# Patient Record
Sex: Female | Born: 1945 | Race: Black or African American | Hispanic: No | Marital: Married | Smoking: Former smoker
Health system: Southern US, Community
[De-identification: ages and names within clinical notes are randomized; demographics above are authoritative.]

## PROBLEM LIST (undated history)

## (undated) DIAGNOSIS — E8941 Symptomatic postprocedural ovarian failure: Secondary | ICD-10-CM

## (undated) DIAGNOSIS — K219 Gastro-esophageal reflux disease without esophagitis: Secondary | ICD-10-CM

## (undated) DIAGNOSIS — K589 Irritable bowel syndrome without diarrhea: Secondary | ICD-10-CM

## (undated) DIAGNOSIS — D126 Benign neoplasm of colon, unspecified: Secondary | ICD-10-CM

## (undated) DIAGNOSIS — M1990 Unspecified osteoarthritis, unspecified site: Secondary | ICD-10-CM

## (undated) DIAGNOSIS — M5430 Sciatica, unspecified side: Secondary | ICD-10-CM

## (undated) DIAGNOSIS — N318 Other neuromuscular dysfunction of bladder: Secondary | ICD-10-CM

## (undated) DIAGNOSIS — E785 Hyperlipidemia, unspecified: Secondary | ICD-10-CM

## (undated) DIAGNOSIS — M545 Low back pain: Secondary | ICD-10-CM

## (undated) DIAGNOSIS — M5126 Other intervertebral disc displacement, lumbar region: Secondary | ICD-10-CM

## (undated) HISTORY — DX: Sciatica, unspecified side: M54.30

## (undated) HISTORY — DX: Gastro-esophageal reflux disease without esophagitis: K21.9

## (undated) HISTORY — DX: Irritable bowel syndrome, unspecified: K58.9

## (undated) HISTORY — DX: Other intervertebral disc displacement, lumbar region: M51.26

## (undated) HISTORY — DX: Low back pain: M54.5

## (undated) HISTORY — DX: Hyperlipidemia, unspecified: E78.5

## (undated) HISTORY — DX: Other neuromuscular dysfunction of bladder: N31.8

## (undated) HISTORY — DX: Symptomatic postprocedural ovarian failure: E89.41

## (undated) HISTORY — DX: Benign neoplasm of colon, unspecified: D12.6

## (undated) HISTORY — DX: Unspecified osteoarthritis, unspecified site: M19.90

---

## 1971-03-05 HISTORY — PX: APPENDECTOMY: SHX54

## 1974-03-04 HISTORY — PX: ABDOMINAL HYSTERECTOMY: SHX81

## 1978-03-04 HISTORY — PX: KIDNEY SURGERY: SHX687

## 1982-03-04 HISTORY — PX: CHOLECYSTECTOMY: SHX55

## 1999-07-25 ENCOUNTER — Encounter: Payer: Self-pay | Admitting: Family Medicine

## 1999-07-25 ENCOUNTER — Encounter: Admission: RE | Admit: 1999-07-25 | Discharge: 1999-07-25 | Payer: Self-pay | Admitting: Family Medicine

## 1999-08-14 ENCOUNTER — Encounter: Payer: Self-pay | Admitting: Family Medicine

## 1999-08-14 ENCOUNTER — Encounter: Admission: RE | Admit: 1999-08-14 | Discharge: 1999-08-14 | Payer: Self-pay | Admitting: Family Medicine

## 1999-11-02 ENCOUNTER — Encounter: Admission: RE | Admit: 1999-11-02 | Discharge: 1999-11-02 | Payer: Self-pay | Admitting: Family Medicine

## 1999-11-02 ENCOUNTER — Encounter: Payer: Self-pay | Admitting: Family Medicine

## 1999-11-07 ENCOUNTER — Encounter: Admission: RE | Admit: 1999-11-07 | Discharge: 1999-11-07 | Payer: Self-pay | Admitting: Family Medicine

## 1999-11-07 ENCOUNTER — Encounter: Payer: Self-pay | Admitting: Family Medicine

## 1999-11-14 ENCOUNTER — Encounter: Payer: Self-pay | Admitting: Family Medicine

## 1999-11-14 ENCOUNTER — Encounter: Admission: RE | Admit: 1999-11-14 | Discharge: 1999-11-14 | Payer: Self-pay | Admitting: Family Medicine

## 1999-11-15 ENCOUNTER — Other Ambulatory Visit: Admission: RE | Admit: 1999-11-15 | Discharge: 1999-11-15 | Payer: Self-pay | Admitting: Family Medicine

## 1999-12-13 ENCOUNTER — Ambulatory Visit (HOSPITAL_COMMUNITY): Admission: RE | Admit: 1999-12-13 | Discharge: 1999-12-13 | Payer: Self-pay | Admitting: Gastroenterology

## 2000-01-13 ENCOUNTER — Emergency Department (HOSPITAL_COMMUNITY): Admission: EM | Admit: 2000-01-13 | Discharge: 2000-01-13 | Payer: Self-pay | Admitting: Emergency Medicine

## 2000-08-29 ENCOUNTER — Encounter: Admission: RE | Admit: 2000-08-29 | Discharge: 2000-08-29 | Payer: Self-pay | Admitting: Occupational Medicine

## 2000-08-29 ENCOUNTER — Encounter: Payer: Self-pay | Admitting: Occupational Medicine

## 2000-09-18 ENCOUNTER — Encounter: Payer: Self-pay | Admitting: Specialist

## 2000-09-19 ENCOUNTER — Ambulatory Visit (HOSPITAL_COMMUNITY): Admission: RE | Admit: 2000-09-19 | Discharge: 2000-09-19 | Payer: Self-pay | Admitting: Specialist

## 2000-09-19 ENCOUNTER — Encounter: Payer: Self-pay | Admitting: Specialist

## 2001-02-17 ENCOUNTER — Encounter: Admission: RE | Admit: 2001-02-17 | Discharge: 2001-03-12 | Payer: Self-pay | Admitting: Orthopedic Surgery

## 2001-06-02 ENCOUNTER — Encounter: Payer: Self-pay | Admitting: Family Medicine

## 2001-06-02 ENCOUNTER — Encounter: Admission: RE | Admit: 2001-06-02 | Discharge: 2001-06-02 | Payer: Self-pay | Admitting: Family Medicine

## 2001-09-29 ENCOUNTER — Encounter: Payer: Self-pay | Admitting: Family Medicine

## 2001-09-29 ENCOUNTER — Encounter: Admission: RE | Admit: 2001-09-29 | Discharge: 2001-09-29 | Payer: Self-pay | Admitting: Family Medicine

## 2001-11-27 ENCOUNTER — Encounter: Payer: Self-pay | Admitting: Urology

## 2001-11-27 ENCOUNTER — Ambulatory Visit (HOSPITAL_COMMUNITY): Admission: RE | Admit: 2001-11-27 | Discharge: 2001-11-27 | Payer: Self-pay | Admitting: Urology

## 2001-11-30 ENCOUNTER — Encounter: Payer: Self-pay | Admitting: Family Medicine

## 2001-11-30 ENCOUNTER — Encounter: Admission: RE | Admit: 2001-11-30 | Discharge: 2001-11-30 | Payer: Self-pay | Admitting: Family Medicine

## 2001-12-01 ENCOUNTER — Encounter: Payer: Self-pay | Admitting: Family Medicine

## 2001-12-01 ENCOUNTER — Encounter: Admission: RE | Admit: 2001-12-01 | Discharge: 2001-12-01 | Payer: Self-pay | Admitting: Family Medicine

## 2002-04-02 ENCOUNTER — Encounter: Payer: Self-pay | Admitting: Family Medicine

## 2002-04-02 ENCOUNTER — Encounter: Admission: RE | Admit: 2002-04-02 | Discharge: 2002-04-02 | Payer: Self-pay | Admitting: Family Medicine

## 2002-10-01 ENCOUNTER — Encounter: Payer: Self-pay | Admitting: Family Medicine

## 2002-10-01 ENCOUNTER — Encounter: Admission: RE | Admit: 2002-10-01 | Discharge: 2002-10-01 | Payer: Self-pay | Admitting: Family Medicine

## 2003-08-29 ENCOUNTER — Emergency Department (HOSPITAL_COMMUNITY): Admission: EM | Admit: 2003-08-29 | Discharge: 2003-08-29 | Payer: Self-pay | Admitting: Emergency Medicine

## 2003-10-04 ENCOUNTER — Encounter: Admission: RE | Admit: 2003-10-04 | Discharge: 2003-10-04 | Payer: Self-pay | Admitting: Family Medicine

## 2004-03-04 HISTORY — PX: COLON SURGERY: SHX602

## 2004-04-16 ENCOUNTER — Encounter: Admission: RE | Admit: 2004-04-16 | Discharge: 2004-04-16 | Payer: Self-pay | Admitting: Family Medicine

## 2004-04-19 ENCOUNTER — Encounter: Payer: Self-pay | Admitting: Family Medicine

## 2004-04-19 ENCOUNTER — Encounter: Admission: RE | Admit: 2004-04-19 | Discharge: 2004-04-19 | Payer: Self-pay | Admitting: Family Medicine

## 2004-04-27 ENCOUNTER — Ambulatory Visit (HOSPITAL_COMMUNITY): Admission: RE | Admit: 2004-04-27 | Discharge: 2004-04-27 | Payer: Self-pay | Admitting: Gastroenterology

## 2004-10-18 ENCOUNTER — Encounter: Admission: RE | Admit: 2004-10-18 | Discharge: 2004-10-18 | Payer: Self-pay | Admitting: Family Medicine

## 2005-04-30 ENCOUNTER — Encounter: Payer: Self-pay | Admitting: Family Medicine

## 2005-04-30 ENCOUNTER — Encounter: Admission: RE | Admit: 2005-04-30 | Discharge: 2005-04-30 | Payer: Self-pay | Admitting: Family Medicine

## 2005-07-18 ENCOUNTER — Other Ambulatory Visit: Admission: RE | Admit: 2005-07-18 | Discharge: 2005-07-18 | Payer: Self-pay | Admitting: Family Medicine

## 2005-07-31 ENCOUNTER — Ambulatory Visit (HOSPITAL_BASED_OUTPATIENT_CLINIC_OR_DEPARTMENT_OTHER): Admission: RE | Admit: 2005-07-31 | Discharge: 2005-07-31 | Payer: Self-pay | Admitting: Orthopedic Surgery

## 2005-08-08 ENCOUNTER — Encounter: Admission: RE | Admit: 2005-08-08 | Discharge: 2005-09-19 | Payer: Self-pay | Admitting: Orthopedic Surgery

## 2005-09-11 ENCOUNTER — Encounter: Admission: RE | Admit: 2005-09-11 | Discharge: 2005-09-11 | Payer: Self-pay | Admitting: Orthopedic Surgery

## 2005-10-21 ENCOUNTER — Encounter: Admission: RE | Admit: 2005-10-21 | Discharge: 2005-10-21 | Payer: Self-pay | Admitting: Family Medicine

## 2006-02-14 ENCOUNTER — Ambulatory Visit: Payer: Self-pay | Admitting: Internal Medicine

## 2006-02-19 ENCOUNTER — Ambulatory Visit: Payer: Self-pay | Admitting: Gastroenterology

## 2006-02-19 ENCOUNTER — Ambulatory Visit: Payer: Self-pay | Admitting: Internal Medicine

## 2006-02-19 ENCOUNTER — Encounter: Payer: Self-pay | Admitting: Family Medicine

## 2006-03-11 ENCOUNTER — Encounter: Payer: Self-pay | Admitting: Family Medicine

## 2006-03-24 ENCOUNTER — Ambulatory Visit: Payer: Self-pay | Admitting: Internal Medicine

## 2006-08-14 ENCOUNTER — Encounter: Payer: Self-pay | Admitting: Family Medicine

## 2006-10-23 ENCOUNTER — Encounter: Admission: RE | Admit: 2006-10-23 | Discharge: 2006-10-23 | Payer: Self-pay | Admitting: Family Medicine

## 2007-01-16 ENCOUNTER — Encounter: Admission: RE | Admit: 2007-01-16 | Discharge: 2007-01-16 | Payer: Self-pay | Admitting: Family Medicine

## 2007-01-16 ENCOUNTER — Encounter: Payer: Self-pay | Admitting: Family Medicine

## 2007-02-17 ENCOUNTER — Ambulatory Visit: Payer: Self-pay | Admitting: Internal Medicine

## 2007-02-25 ENCOUNTER — Encounter: Payer: Self-pay | Admitting: Family Medicine

## 2007-02-25 ENCOUNTER — Encounter: Payer: Self-pay | Admitting: Internal Medicine

## 2007-02-25 ENCOUNTER — Ambulatory Visit: Payer: Self-pay | Admitting: Internal Medicine

## 2007-02-25 LAB — HM COLONOSCOPY: HM Colonoscopy: ABNORMAL

## 2007-03-05 HISTORY — PX: OVARIAN CYST REMOVAL: SHX89

## 2007-03-24 ENCOUNTER — Encounter: Payer: Self-pay | Admitting: Family Medicine

## 2007-07-16 ENCOUNTER — Inpatient Hospital Stay (HOSPITAL_COMMUNITY): Admission: RE | Admit: 2007-07-16 | Discharge: 2007-07-19 | Payer: Self-pay | Admitting: Obstetrics and Gynecology

## 2007-07-16 ENCOUNTER — Encounter: Payer: Self-pay | Admitting: Family Medicine

## 2007-07-16 ENCOUNTER — Encounter (INDEPENDENT_AMBULATORY_CARE_PROVIDER_SITE_OTHER): Payer: Self-pay | Admitting: Obstetrics and Gynecology

## 2007-10-26 ENCOUNTER — Encounter: Admission: RE | Admit: 2007-10-26 | Discharge: 2007-10-26 | Payer: Self-pay | Admitting: Family Medicine

## 2008-01-07 ENCOUNTER — Encounter: Payer: Self-pay | Admitting: Family Medicine

## 2008-02-29 ENCOUNTER — Ambulatory Visit: Payer: Self-pay | Admitting: Family Medicine

## 2008-02-29 DIAGNOSIS — E785 Hyperlipidemia, unspecified: Secondary | ICD-10-CM

## 2008-02-29 DIAGNOSIS — E8941 Symptomatic postprocedural ovarian failure: Secondary | ICD-10-CM

## 2008-02-29 DIAGNOSIS — R635 Abnormal weight gain: Secondary | ICD-10-CM | POA: Insufficient documentation

## 2008-02-29 DIAGNOSIS — N318 Other neuromuscular dysfunction of bladder: Secondary | ICD-10-CM

## 2008-02-29 HISTORY — DX: Hyperlipidemia, unspecified: E78.5

## 2008-02-29 HISTORY — DX: Symptomatic postprocedural ovarian failure: E89.41

## 2008-02-29 HISTORY — DX: Other neuromuscular dysfunction of bladder: N31.8

## 2008-03-01 LAB — CONVERTED CEMR LAB
ALT: 17 units/L (ref 0–35)
Calcium: 8.4 mg/dL (ref 8.4–10.5)
Chloride: 106 meq/L (ref 96–112)
Cholesterol: 152 mg/dL (ref 0–200)
Potassium: 4.1 meq/L (ref 3.5–5.3)
Sodium: 140 meq/L (ref 135–145)
Total Bilirubin: 0.7 mg/dL (ref 0.3–1.2)
Total Protein: 6.3 g/dL (ref 6.0–8.3)
VLDL: 9 mg/dL (ref 0–40)

## 2008-03-15 ENCOUNTER — Encounter: Payer: Self-pay | Admitting: Family Medicine

## 2008-04-14 ENCOUNTER — Telehealth: Payer: Self-pay | Admitting: Family Medicine

## 2008-04-20 ENCOUNTER — Encounter: Payer: Self-pay | Admitting: Internal Medicine

## 2008-10-03 ENCOUNTER — Encounter: Payer: Self-pay | Admitting: Family Medicine

## 2008-10-26 ENCOUNTER — Encounter: Admission: RE | Admit: 2008-10-26 | Discharge: 2008-10-26 | Payer: Self-pay | Admitting: Internal Medicine

## 2009-05-25 ENCOUNTER — Telehealth: Payer: Self-pay | Admitting: Internal Medicine

## 2009-05-26 ENCOUNTER — Telehealth: Payer: Self-pay | Admitting: Internal Medicine

## 2009-05-29 ENCOUNTER — Ambulatory Visit: Payer: Self-pay | Admitting: Family Medicine

## 2009-05-29 DIAGNOSIS — D126 Benign neoplasm of colon, unspecified: Secondary | ICD-10-CM

## 2009-05-29 DIAGNOSIS — K219 Gastro-esophageal reflux disease without esophagitis: Secondary | ICD-10-CM

## 2009-05-29 HISTORY — DX: Gastro-esophageal reflux disease without esophagitis: K21.9

## 2009-05-29 HISTORY — DX: Benign neoplasm of colon, unspecified: D12.6

## 2009-05-29 LAB — CONVERTED CEMR LAB
Cholesterol, target level: 200 mg/dL
LDL Goal: 160 mg/dL

## 2009-05-31 LAB — CONVERTED CEMR LAB
Bilirubin, Direct: 0 mg/dL (ref 0.0–0.3)
HDL: 59.9 mg/dL (ref >39.00)
LDL Cholesterol: 93 mg/dL (ref 0–99)
Total CHOL/HDL Ratio: 3

## 2009-10-27 ENCOUNTER — Encounter: Admission: RE | Admit: 2009-10-27 | Discharge: 2009-10-27 | Payer: Self-pay | Admitting: Family Medicine

## 2009-11-08 ENCOUNTER — Ambulatory Visit: Payer: Self-pay | Admitting: Family Medicine

## 2009-11-08 LAB — CONVERTED CEMR LAB
Alkaline Phosphatase: 39 units/L (ref 39–117)
Basophils Absolute: 0.1 10*3/uL (ref 0.0–0.1)
Bilirubin, Direct: 0.2 mg/dL (ref 0.0–0.3)
Calcium: 8.6 mg/dL (ref 8.4–10.5)
Cholesterol: 165 mg/dL (ref 0–200)
Eosinophils Absolute: 0.2 10*3/uL (ref 0.0–0.7)
GFR calc non Af Amer: 122.24 mL/min (ref >60)
Glucose, Bld: 71 mg/dL (ref 70–99)
Glucose, Urine, Semiquant: NEGATIVE
HDL: 44.5 mg/dL (ref >39.00)
Hemoglobin: 14 g/dL (ref 12.0–15.0)
Lymphs Abs: 3.5 10*3/uL (ref 0.7–4.0)
Monocytes Absolute: 0.6 10*3/uL (ref 0.1–1.0)
Neutro Abs: 4.4 10*3/uL (ref 1.4–7.7)
Potassium: 4.1 meq/L (ref 3.5–5.1)
Protein, U semiquant: NEGATIVE
RBC: 4.85 M/uL (ref 3.87–5.11)
Sodium: 141 meq/L (ref 135–145)
Specific Gravity, Urine: 1.02
Total Bilirubin: 0.9 mg/dL (ref 0.3–1.2)
Total CHOL/HDL Ratio: 4
VLDL: 7 mg/dL (ref 0.0–40.0)
WBC: 8.8 10*3/uL (ref 4.5–10.5)
pH: 7.5

## 2009-11-16 ENCOUNTER — Ambulatory Visit: Payer: Self-pay | Admitting: Family Medicine

## 2009-11-28 ENCOUNTER — Telehealth: Payer: Self-pay | Admitting: Family Medicine

## 2009-12-01 ENCOUNTER — Ambulatory Visit: Payer: Self-pay | Admitting: Family Medicine

## 2009-12-01 LAB — CONVERTED CEMR LAB
OCCULT 2: NEGATIVE
OCCULT 3: NEGATIVE

## 2010-01-02 ENCOUNTER — Telehealth: Payer: Self-pay | Admitting: Family Medicine

## 2010-01-03 ENCOUNTER — Telehealth: Payer: Self-pay | Admitting: Family Medicine

## 2010-01-10 ENCOUNTER — Telehealth: Payer: Self-pay | Admitting: Family Medicine

## 2010-01-11 ENCOUNTER — Encounter: Payer: Self-pay | Admitting: Family Medicine

## 2010-02-12 ENCOUNTER — Encounter: Payer: Self-pay | Admitting: Family Medicine

## 2010-02-27 ENCOUNTER — Ambulatory Visit
Admission: RE | Admit: 2010-02-27 | Discharge: 2010-02-27 | Payer: Self-pay | Source: Home / Self Care | Attending: Internal Medicine | Admitting: Internal Medicine

## 2010-02-28 ENCOUNTER — Telehealth: Payer: Self-pay | Admitting: Internal Medicine

## 2010-03-01 ENCOUNTER — Ambulatory Visit
Admission: RE | Admit: 2010-03-01 | Discharge: 2010-03-01 | Payer: Self-pay | Source: Home / Self Care | Attending: Family Medicine | Admitting: Family Medicine

## 2010-03-12 ENCOUNTER — Telehealth: Payer: Self-pay | Admitting: Family Medicine

## 2010-03-12 DIAGNOSIS — M545 Low back pain, unspecified: Secondary | ICD-10-CM

## 2010-03-12 HISTORY — DX: Low back pain, unspecified: M54.50

## 2010-03-13 ENCOUNTER — Telehealth: Payer: Self-pay | Admitting: Internal Medicine

## 2010-03-14 ENCOUNTER — Encounter
Admission: RE | Admit: 2010-03-14 | Discharge: 2010-03-14 | Payer: Self-pay | Source: Home / Self Care | Attending: Family Medicine | Admitting: Family Medicine

## 2010-03-15 ENCOUNTER — Ambulatory Visit
Admission: RE | Admit: 2010-03-15 | Discharge: 2010-03-15 | Payer: Self-pay | Source: Home / Self Care | Attending: Family Medicine | Admitting: Family Medicine

## 2010-03-16 ENCOUNTER — Encounter: Payer: Self-pay | Admitting: Family Medicine

## 2010-04-03 NOTE — Progress Notes (Signed)
Summary: Pt called re: Aciphex and re: getting colonoscopy sch  Phone Note Call from Patient Call back at [phone] cell   Caller: Patient Summary of Call: Pt called and said that the Aciphex is doing well, but pts stomach is still sore. Pt said that she needs to get colonoscopy sch. Pls call.  Initial call taken by: Lucy Antigua,  November 28, 2009 2:23 PM  Follow-up for Phone Call        pt had colonoscopy in 2008 with recommended f/u in 5 years.  My recommendation for seeing GI at this time would be if she is having ongoing abd pain on the Aciphex.  If pain is improving on Aciphex, she may wish to  give this  full 4- 6 weeks. Follow-up by: Evelena Peat MD,  November 28, 2009 2:49 PM  Additional Follow-up for Phone Call Additional follow up Details #1::        Pt informed and she voiced his understanding Additional Follow-up by: Sid Falcon LPN,  November 28, 2009 5:32 PM

## 2010-04-03 NOTE — Progress Notes (Signed)
Summary: pt wants to cancel pa for Aciphex  Phone Note Call from Patient Call back at Work Phone [phone]   Caller: Patient-live call Summary of Call: cancel the prior auth for Aciphex. She wants to switch to prevacid. Call [facility] in [phone] Initial call taken by: Warnell Forester,  January 03, 2010 4:08 PM  Follow-up for Phone Call        [facility] to call in Prevacid 30 mg daily. Follow-up by: Evelena Peat MD,  January 03, 2010 5:37 PM    New/Updated Medications: PREVACID 30 MG CPDR (LANSOPRAZOLE) once daily Prescriptions: PREVACID 30 MG CPDR (LANSOPRAZOLE) once daily  #30 x 11   Entered by:   Sid Falcon LPN   Authorized by:   Evelena Peat MD   Signed by:   Sid Falcon LPN on 78/46/9629   Method used:   Electronically to        [facility] [phone]* (retail)       [address] [place], [place]  [address]       Ph: [phone] or [phone]       Fax: [phone]   RxID:   [id number]

## 2010-04-03 NOTE — Progress Notes (Signed)
Summary: REQUEST FOR WRITTEN RX (Prevacid) - SEE NOTATION  Phone Note Refill Request Message from:  Patient on January 10, 2010 9:54 AM  Refills Requested: Medication #1:  PREVACID 30 MG CPDR once daily   Notes: Pt would like to have written script that she will take to [facility] herself and have it filled - pt wants to pay out of pocket for med because she is tired of dealing with insurance.  Pt would like to have written script that she will take to [facility] herself and have it filled - pt wants to pay out of pocket for med because she is tired of dealing with insurance.   Initial call taken by: Debbra Riding,  January 10, 2010 9:56 AM  Follow-up for Phone Call        written Follow-up by: Evelena Peat MD,  January 10, 2010 1:04 PM  Additional Follow-up for Phone Call Additional follow up Details #1::        Pt informed ready for pick-up Additional Follow-up by: Sid Falcon LPN,  January 10, 2010 1:12 PM    Prescriptions: PREVACID 30 MG CPDR (LANSOPRAZOLE) once daily  #30 x 11   Entered by:   Sid Falcon LPN   Authorized by:   Evelena Peat MD   Signed by:   Sid Falcon LPN on 24/40/1027   Method used:   Print then Give to Patient   RxID:   [id number]

## 2010-04-03 NOTE — Progress Notes (Signed)
Summary: triage / constipation, vomit, wants colonoscopy  Phone Note Call from Patient Call back at [facility] Phone [phone] Call back at [phone]  (cell)   Caller: Patient Call For: Dr. Marina Goodell Reason for Call: Talk to Nurse Summary of Call: pt says she "spit up black and red blood this morning"...  Initial call taken by: Vallarie Mare,  May 25, 2009 12:24 PM  Follow-up for Phone Call         Pt. took a Dulcolax tab. last pm but denies constipation.Says she just wanted to clean her system out because she felt bloated.This am had loose dk. brown  stool and at the same time she vomited some water that was mixed with some streaks of BRB followed by green liquid x1..Wants a colonoscopy because last one was 4 years ago. Follow-up by: Teryl Lucy RN,  May 25, 2009 1:40 PM  Additional Follow-up for Phone Call Additional follow up Details #1::        She is not due for colon. last colonoscopy 02-2007. Has not been seen since. She can schedule a routine visit with me or see an extender sooner for office eval Additional Follow-up by: Hilarie Fredrickson MD,  May 25, 2009 1:44 PM    Additional Follow-up for Phone Call Additional follow up Details #2::    pt called back to add that she is "very nautious" Follow-up by: Vallarie Mare,  May 25, 2009 1:59 PM  Additional Follow-up for Phone Call Additional follow up Details #3:: Details for Additional Follow-up Action Taken: Given appt. with the NP on Tuesday she is requesting eval. of symptoms.No further vomiting has occurred.Knows to go to er if lg.amt of bleeding occurs. Additional Follow-up by: Teryl Lucy RN,  May 25, 2009 2:13 PM

## 2010-04-03 NOTE — Assessment & Plan Note (Signed)
Summary: NEW TO EST/CCM   Vital Signs:  Patient profile:   65 year old female Menstrual status:  hysterectomy Height:      58 inches Weight:      182 pounds BMI:     38.18 Temp:     98.7 degrees F oral Pulse rate:   80 / minute Pulse rhythm:   regular Resp:     12 per minute BP sitting:   130 / 84  (left arm) Cuff size:   large  Vitals Entered By: Sid Falcon LPN (May 29, 2009 10:59 AM)  Nutrition Counseling: Patient's BMI is greater than 25 and therefore counseled on weight management options. CC: New to establish, Lipid Management, Abdominal Pain     Menstrual Status hysterectomy Last PAP Result Unknown   History of Present Illness: New pt to establish care.    Hx GERD controlled with Nexium.  Recurrent sxs not controlled with Prilosec.  Hx colon polyps and reportedly due for repeat colonoscopy 2013.  Hyperlipidemia treated with Simvastatin.  No side effects.  No hx CAD or PVD.  Dyspepsia History:      She has no alarm features of dyspepsia including no history of melena, hematochezia, dysphagia, persistent vomiting, or involuntary weight loss > 5%.  There is a prior history of GERD.  The patient does not have a prior history of documented ulcer disease.  The dominant symptom is heartburn or acid reflux.  An H-2 blocker medication is not currently being taken.  She has no history of a positive H. Pylori serology.  A prior EGD has been done which showed no evidence for moderate or severe esophagitis or Barrett's esophagus.    Lipid Management History:      Positive NCEP/ATP III risk factors include female age 39 years old or older.  Negative NCEP/ATP III risk factors include non-diabetic, no family history for ischemic heart disease, non-tobacco-user status, non-hypertensive, no ASHD (atherosclerotic heart disease), no prior stroke/TIA, no peripheral vascular disease, and no history of aortic aneurysm.      Allergies: 1)  ! Pcn 2)  ! Codeine 3)  ! Sulfa  Past  History:  Past Surgical History: Last updated: 02/29/2008 Cyst removed from the urinary tract by Dr. Donovan Kail.  Complete Hysterectomy 1976 Cholecystectomy 1982 Left Kidney surgery to help rotate kidney 1980 Colon polyp removed 2006   Family History: Last updated: 05/29/2009 Mother Pancreatic cancer Brother 75 cerebral aneurysm Sister hyperlipidemia.  Social History: Last updated: 05/29/2009 REtired.  Married to [employer] with 2 daughters.   Former Smoker Quit 70s Alcohol use-no Drug use-no Regular exercise-yes  Risk Factors: Caffeine Use: 0 (02/29/2008) Exercise: yes (02/29/2008)  Risk Factors: Smoking Status: quit (02/29/2008)  Past Medical History: GERD Hyperlipidemia Colon polyps  Family History: Mother Pancreatic cancer Brother 14 cerebral aneurysm Sister hyperlipidemia.  Social History: REtired.  Married to [employer] with 2 daughters.   Former Smoker Quit 70s Alcohol use-no Drug use-no Regular exercise-yes  Review of Systems  The patient denies anorexia, fever, weight loss, weight gain, chest pain, syncope, dyspnea on exertion, peripheral edema, prolonged cough, headaches, hemoptysis, abdominal pain, melena, hematochezia, severe indigestion/heartburn, hematuria, incontinence, and muscle weakness.    Physical Exam  General:  Well-developed,well-nourished,in no acute distress; alert,appropriate and cooperative throughout examination Head:  Normocephalic and atraumatic without obvious abnormalities. No apparent alopecia or balding. Ears:  External ear exam shows no significant lesions or deformities.  Otoscopic examination reveals clear canals, tympanic membranes are intact bilaterally without bulging, retraction, inflammation  or discharge. Hearing is grossly normal bilaterally. Mouth:  Oral mucosa and oropharynx without lesions or exudates.  Teeth in good repair. Neck:  No deformities, masses, or tenderness noted. Lungs:  Normal respiratory effort,  chest expands symmetrically. Lungs are clear to auscultation, no crackles or wheezes. Heart:  normal rate and regular rhythm.   Extremities:  no edema.   Impression & Recommendations:  Problem # 1:  HYPERLIPIDEMIA (ICD-272.4) reassess labs Her updated medication list for this problem includes:    Simvastatin 20 Mg Tabs (Simvastatin) .Marland Kitchen... Take 1 tablet by mouth once a day at bedtime  Orders: TLB-Hepatic/Liver Function Pnl (80076-HEPATIC) TLB-Lipid Panel (80061-LIPID)  Problem # 2:  GERD (ICD-530.81) Assessment: Deteriorated Change to Nexium and pt instructed to give feedback if no improvement in 1 month. Her updated medication list for this problem includes:    Nexium 40 Mg Cpdr (Esomeprazole magnesium) .Marland Kitchen... Take 1 tablet by mouth once a day  Problem # 3:  COLONIC POLYPS (ICD-211.3) Assessment: Unchanged  Complete Medication List: 1)  Nexium 40 Mg Cpdr (Esomeprazole magnesium) .... Take 1 tablet by mouth once a day 2)  Est Estrogens-methyltest 1.25-2.5 Mg Tabs (Est estrogens-methyltest) .... Once daily 3)  Simvastatin 20 Mg Tabs (Simvastatin) .... Take 1 tablet by mouth once a day at bedtime  Dyspepsia Assessment/Plan:  Step Therapy: GERD Treatment Protocols:    Step-1: failed  Lipid Assessment/Plan:      Based on NCEP/ATP III, the patient's risk factor category is "0-1 risk factors".  The patient's lipid goals are as follows: Total cholesterol goal is 200; LDL cholesterol goal is 160; HDL cholesterol goal is 40; Triglyceride goal is 150.    Patient Instructions: 1)  Please schedule a follow-up appointment in 6 months .  Prescriptions: SIMVASTATIN 20 MG TABS (SIMVASTATIN) Take 1 tablet by mouth once a day at bedtime  #90 x 3   Entered and Authorized by:   Evelena Peat MD   Signed by:   Evelena Peat MD on 05/29/2009   Method used:   Electronically to        [facility] [phone]* (retail)       [address] [place], [place]  [address]        Ph: [phone] or [phone]       Fax: [phone]   RxID:   [id number] NEXIUM 40 MG CPDR (ESOMEPRAZOLE MAGNESIUM) Take 1 tablet by mouth once a day  #90 x 3   Entered and Authorized by:   Evelena Peat MD   Signed by:   Evelena Peat MD on 05/29/2009   Method used:   Electronically to        [facility] [phone]* (retail)       [address] [place], [place]  [address]       Ph: [phone] or [phone]       Fax: [phone]   RxID:   [id number]

## 2010-04-03 NOTE — Progress Notes (Signed)
Summary: Triage  Phone Note Call from Patient Call back at Home Phone [phone]   Caller: Patient Call For: Dr. Marina Goodell Reason for Call: Talk to Nurse Summary of Call: Update: Pt feels better today than she did yesterday and would like to discuss it with you Initial call taken by: Karna Christmas,  May 26, 2009 11:29 AM  Follow-up for Phone Call         Pt. is feeling better as she took her Nexium and abd. pain went away.Is establishing with new PCP and she will have him re-order her Nexium.Appt. with Gunnar Fusi cx per her request. Follow-up by: Teryl Lucy RN,  May 26, 2009 11:39 AM

## 2010-04-03 NOTE — Progress Notes (Signed)
Summary: REFILL REQUEST Aciphex X 3 months  Phone Note Refill Request Message from:  Patient on January 02, 2010 3:23 PM  Refills Requested: Medication #1:  ACIPHEX 20 MG TBEC one by mouth once daily   [facility] [address] [facility] [employer].    Initial call taken by: Debbra Riding,  January 02, 2010 3:24 PM    Prescriptions: ACIPHEX 20 MG TBEC (RABEPRAZOLE SODIUM) one by mouth once daily  #30 x 3   Entered by:   Sid Falcon LPN   Authorized by:   Evelena Peat MD   Signed by:   Sid Falcon LPN on 04/54/0981   Method used:   Electronically to        [facility] [phone]* (retail)       [address] [place], [place]  [address]       Ph: [phone] or [phone]       Fax: [phone]   RxID:   [id number]

## 2010-04-03 NOTE — Assessment & Plan Note (Signed)
Summary: cpx/njr/pt rsc/cjr   Vital Signs:  Patient profile:   65 year old female Menstrual status:  hysterectomy Height:      57.75 inches Weight:      181 pounds Temp:     98.6 degrees F oral Pulse rate:   72 / minute Pulse rhythm:   regular Resp:     12 per minute BP sitting:   102 / 80  (left arm) Cuff size:   large  Vitals Entered By: Sid Falcon LPN (November 16, 2009 10:52 AM)  History of Present Illness: Here for CPE. Sees GYN. Colonoscopy up to date. No regular exercise.  Separate issue of some midepigastric pain for the past several weeks. Has taken Nexium in past but not  consistently. Pain is moderate  and intermittent and does not radiate. Sharp to achy quality.   Nonexertional .   No chest pain. No vomiting.  Occ nausea.  No alleviating factors.  No clear aggravating  features.  Clinical Review Panels:  Prevention   Last Mammogram:  ASSESSMENT: Negative - BI-RADS 1^MM DIGITAL SCREENING (10/27/2009)   Last Pap Smear:  Unknown (09/02/2007)   Last Colonoscopy:  Abnormal (02/25/2007)  Immunizations   Last Tetanus Booster:  Tdap (07/02/2005)   Last Flu Vaccine:  Fluvax 3+ (11/16/2009)  Lipid Management   Cholesterol:  165 (11/08/2009)   LDL (bad choesterol):  114 (11/08/2009)   HDL (good cholesterol):  44.50 (11/08/2009)  Diabetes Management   Creatinine:  0.6 (11/08/2009)   Last Flu Vaccine:  Fluvax 3+ (11/16/2009)  CBC   WBC:  8.8 (11/08/2009)   RBC:  4.85 (11/08/2009)   Hgb:  14.0 (11/08/2009)   Hct:  42.4 (11/08/2009)   Platelets:  184.0 (11/08/2009)   MCV  87.4 (11/08/2009)   MCHC  33.0 (11/08/2009)   RDW  14.3 (11/08/2009)   PMN:  50.2 (11/08/2009)   Lymphs:  40.1 (11/08/2009)   Monos:  6.5 (11/08/2009)   Eosinophils:  2.3 (11/08/2009)   Basophil:  0.9 (11/08/2009)  Complete Metabolic Panel   Glucose:  71 (11/08/2009)   Sodium:  141 (11/08/2009)   Potassium:  4.1 (11/08/2009)   Chloride:  106 (11/08/2009)   CO2:  29  (11/08/2009)   BUN:  10 (11/08/2009)   Creatinine:  0.6 (11/08/2009)   Albumin:  3.4 (11/08/2009)   Total Protein:  6.0 (11/08/2009)   Calcium:  8.6 (11/08/2009)   Total Bili:  0.9 (11/08/2009)   Alk Phos:  39 (11/08/2009)   SGPT (ALT):  18 (11/08/2009)   SGOT (AST):  20 (11/08/2009)   Allergies: 1)  ! Pcn 2)  ! Codeine 3)  ! Sulfa  Past History:  Past Medical History: Last updated: 05/29/2009 GERD Hyperlipidemia Colon polyps  Past Surgical History: Last updated: 02/29/2008 Cyst removed from the urinary tract by Dr. Donovan Kail.  Complete Hysterectomy 1976 Cholecystectomy 1982 Left Kidney surgery to help rotate kidney 1980 Colon polyp removed 2006   Family History: Last updated: 05/29/2009 Mother Pancreatic cancer Brother 21 cerebral aneurysm Sister hyperlipidemia.  Social History: Last updated: 05/29/2009 REtired.  Married to [employer] with 2 daughters.   Former Smoker Quit 70s Alcohol use-no Drug use-no Regular exercise-yes  Risk Factors: Caffeine Use: 0 (02/29/2008) Exercise: yes (02/29/2008)  Risk Factors: Smoking Status: quit (02/29/2008) PMH-FH-SH reviewed for relevance  Review of Systems  The patient denies anorexia, fever, weight loss, vision loss, decreased hearing, hoarseness, chest pain, syncope, dyspnea on exertion, peripheral edema, prolonged cough, headaches, hemoptysis, abdominal pain, melena, hematochezia, hematuria,  incontinence, genital sores, muscle weakness, suspicious skin lesions, transient blindness, difficulty walking, depression, enlarged lymph nodes, and breast masses.         midepigastric pain for several weeks. Takes  Nexium most days.   Physical Exam  General:  Well-developed,well-nourished,in no acute distress; alert,appropriate and cooperative throughout examination Head:  Normocephalic and atraumatic without obvious abnormalities. No apparent alopecia or balding. Eyes:  pupils equal, pupils round, and pupils reactive to  light.   Ears:  External ear exam shows no significant lesions or deformities.  Otoscopic examination reveals clear canals, tympanic membranes are intact bilaterally without bulging, retraction, inflammation or discharge. Hearing is grossly normal bilaterally. Mouth:  Oral mucosa and oropharynx without lesions or exudates.  Teeth in good repair. Neck:  No deformities, masses, or tenderness noted. Breasts:  gyn Lungs:  Normal respiratory effort, chest expands symmetrically. Lungs are clear to auscultation, no crackles or wheezes. Heart:  Normal rate and regular rhythm. S1 and S2 normal without gallop, murmur, click, rub or other extra sounds. Abdomen:  soft, normal bowel sounds, no distention, no masses, no hepatomegaly, and no splenomegaly.  Slighty tender midepigastric. Extremities:  No clubbing, cyanosis, edema, or deformity noted with normal full range of motion of all joints.   Neurologic:  alert & oriented X3, cranial nerves II-XII intact, strength normal in all extremities, and DTRs symmetrical and normal.   Skin:  no rashes and no suspicious lesions.   Cervical Nodes:  No lymphadenopathy noted Psych:  Cognition and judgment appear intact. Alert and cooperative with normal attention span and concentration. No apparent delusions, illusions, hallucinations   Impression & Recommendations:  Problem # 1:  Preventive Health Care (ICD-V70.0) needs to work on weight loss.  Flu vaccine given.  Labs reviewed.  Problem # 2:  GERD (ICD-530.81) Assessment: Deteriorated trial of Aciphex and lifestyle factors discussed.  Consider GI referral if no better in 2-3 weeks. Her updated medication list for this problem includes:    Aciphex 20 Mg Tbec (Rabeprazole sodium) ..... One by mouth once daily  Complete Medication List: 1)  Aciphex 20 Mg Tbec (Rabeprazole sodium) .... One by mouth once daily 2)  Est Estrogens-methyltest 1.25-2.5 Mg Tabs (Est estrogens-methyltest) .... Once daily 3)  Simvastatin  20 Mg Tabs (Simvastatin) .... Take 1 tablet by mouth once a day at bedtime  Other Orders: Admin 1st Vaccine ([address]) Flu Vaccine 15yrs + ([address])  Patient Instructions: 1)  Take Aciphex 20 mg one daily for GERD symptoms. 2)  Eat plenty of good quality proteins- egg whites, peanut butter, chicken, fish, beans, and low fat dairy. 3)  It is important that you exercise reguarly at least 20 minutes 5 times a week. If you develop chest pain, have severe difficulty breathing, or feel very tired, stop exercising immediately and seek medical attention.  4)  You need to lose weight. Consider a lower calorie diet and regular exercise.  5)  Call in 2-3 weeks if abdominal no better. Prescriptions: ACIPHEX 20 MG TBEC (RABEPRAZOLE SODIUM) one by mouth once daily  #30 x 1   Entered and Authorized by:   Evelena Peat MD   Signed by:   Evelena Peat MD on 11/16/2009   Method used:   Print then Give to Patient   RxID:   [id number] SIMVASTATIN 20 MG TABS (SIMVASTATIN) Take 1 tablet by mouth once a day at bedtime  #90 x 3   Entered and Authorized by:   Evelena Peat MD   Signed by:   Smitty Cords  Burchette MD on 11/16/2009   Method used:   Electronically to        [facility] [phone]* (retail)       [address] [place], [place]  [address]       Ph: [phone] or [phone]       Fax: [phone]   RxID:   [id number]   Preventive Care Screening  Last Tetanus Booster:    Date:  07/02/2005    Results:  Tdap     Immunization History:  Hepatitis B History:    Hepatitis B # 7:  yes (06/02/1989)    Flu Vaccine Consent Questions     Do you have a history of severe allergic reactions to this vaccine? no    Any prior history of allergic reactions to egg and/or gelatin? no    Do you have a sensitivity to the preservative Thimersol? no    Do you have a past history of Guillan-Barre Syndrome? no    Do you currently have an acute febrile illness? no    Have you  ever had a severe reaction to latex? no    Vaccine information given and explained to patient? yes    Are you currently pregnant? no    Lot Number:[id number]   Exp Date:09/01/2010   Site Given  Left Deltoid IMbflu

## 2010-04-03 NOTE — Assessment & Plan Note (Signed)
Summary: INNER EAR ISSUES/RCD   Vital Signs:  Patient profile:   65 year old female Menstrual status:  hysterectomy Weight:      183 pounds Temp:     97.8 degrees F oral BP sitting:   120 / 84  (left arm) Cuff size:   regular  Vitals Entered By: Sid Falcon LPN (November 08, 2009 8:52 AM)  History of Present Illness: Patient seen with 1-2 day history right ear fullness. No nasal congestion. Occasional dry cough.  No definite hearing change. Denies any dizziness or vertigo. No fevers, chills, or external canal drainage.  Patient does occasionally clean ears with Q-tip. She is not aware any history of foreign bodies to right ear canal.  Allergies: 1)  ! Pcn 2)  ! Codeine 3)  ! Sulfa  Past History:  Past Medical History: Last updated: 05/29/2009 GERD Hyperlipidemia Colon polyps PMH reviewed for relevance  Physical Exam  General:  Well-developed,well-nourished,in no acute distress; alert,appropriate and cooperative throughout examination Ears:  patient has whitish colored foreign body right canal with the appearance of cotton. The left canal and eardrum are normal Mouth:  Oral mucosa and oropharynx without lesions or exudates.  Teeth in good repair.   Impression & Recommendations:  Problem # 1:  FOREIGN BODY, EAR, LEFT (ICD-931) Assessment New we were able to remove this with irrigation and forceps. Initially attempted with suction but unable to remove.   With irrigation, removed in entirety. No signs of otitis externa. She appears to have ear a small serous-like effusion behind the eardrum but no other acute findings  Complete Medication List: 1)  Nexium 40 Mg Cpdr (Esomeprazole magnesium) .... Take 1 tablet by mouth once a day 2)  Est Estrogens-methyltest 1.25-2.5 Mg Tabs (Est estrogens-methyltest) .... Once daily 3)  Simvastatin 20 Mg Tabs (Simvastatin) .... Take 1 tablet by mouth once a day at bedtime

## 2010-04-03 NOTE — Medication Information (Signed)
Summary: PA and Approval for Lansoprazole  PA and Approval for Lansoprazole   Imported By: Maryln Gottron 01/16/2010 13:16:11  _____________________________________________________________________  External Attachment:    Type:   Image     Comment:   External Document

## 2010-04-05 NOTE — Progress Notes (Signed)
Summary: Requesting MRI  Phone Note Call from Patient Call back at Home Phone [phone]   Caller: Patient Call For: Evelena Peat MD Summary of Call: Pt states leg is worse, was in severe pain over the weekend with cramping.  Was told to take naproxen however does not want to continue due to GI bleeding.  Pt requesting a MRI. Initial call taken by: Trixie Dredge,  March 12, 2010 9:06 AM  Follow-up for Phone Call        pt cb. pt is aware doc has not responded to message yet Follow-up by: Heron Sabins,  March 12, 2010 3:10 PM  Additional Follow-up for Phone Call Additional follow up Details #1::        Will go ahead with setting up MRI. notify pt we are setting up. Additional Follow-up by: Evelena Peat MD,  March 12, 2010 10:38 PM  New Problems: LUMBAGO (ICD-724.2)   Additional Follow-up for Phone Call Additional follow up Details #2::    Pt informed on home VM Follow-up by: Sid Falcon LPN,  March 13, 2010 8:17 AM  New Problems: LUMBAGO (ICD-724.2)

## 2010-04-05 NOTE — Letter (Signed)
Summary: [facility]  [facility]   Imported By: Lanelle Bal 02/20/2010 13:28:40  _____________________________________________________________________  External Attachment:    Type:   Image     Comment:   External Document

## 2010-04-05 NOTE — Progress Notes (Signed)
Summary: MRI tomorrow, requesting Xanax  Phone Note Call from Patient   Caller: Patient Call For: Kwiatkowski/Burchette Summary of Call: Pt scheduled for MRI Lumbar Spine tomorrow 7:15am.  Dr Caryl Never 1/2 day, requesting order for Xanax for procedure  [facility] Initial call taken by: Sid Falcon LPN,  March 13, 2010 2:30 PM  Follow-up for Phone Call        generic xanax 05 mg  #6 one 60 miutes prior to procedure Follow-up by: Gordy Savers  MD,  March 13, 2010 3:10 PM  Additional Follow-up for Phone Call Additional follow up Details #1::        Rx called in, pt infomed Additional Follow-up by: Sid Falcon LPN,  March 13, 2010 4:11 PM    New/Updated Medications: XANAX 0.5 MG TABS (ALPRAZOLAM) one tab 60 minutes prior to procedule Prescriptions: XANAX 0.5 MG TABS (ALPRAZOLAM) one tab 60 minutes prior to procedule  #6 x 0   Entered by:   Sid Falcon LPN   Authorized by:   Gordy Savers  MD   Signed by:   Sid Falcon LPN on 65/78/4696   Method used:   Telephoned to ...       [facility] [phone]* (retail)       [address] [place], [place]  [address]       Ph: [phone] or [phone]       Fax: [phone]   RxID:   [id number]

## 2010-04-05 NOTE — Assessment & Plan Note (Signed)
Summary: 2 week fup//ccm   Vital Signs:  Patient profile:   65 year old female Menstrual status:  hysterectomy Weight:      178 pounds Temp:     98.0 degrees F oral BP sitting:   140 / 90  (left arm) Cuff size:   regular  Vitals Entered By: Sid Falcon LPN (March 15, 2010 10:02 AM)  History of Present Illness: Followup back pain. Refer prior notes. Continued sharp to deep achy pain lower right lumbar and buttock and down to lower thigh region. Naproxen and Percocet  help somewhat. Pain worse with change of position and somewhat with sitting. No numbness and no urine incontinence symptoms. Recent MRI obtained and shows disc protrusion S1-S2 with some mass effect on exiting R nerve root. Other probably incidental findings of bulging at various levels more proximal. Patient had several weeks of pain not improved with conservative therapy. Intolerant of prednisone.  Allergies: 1)  ! Pcn 2)  ! Codeine 3)  ! Sulfa  Past History:  Past Medical History: Last updated: 05/29/2009 GERD Hyperlipidemia Colon polyps  Past Surgical History: Last updated: 02/29/2008 Cyst removed from the urinary tract by Dr. Donovan Kail.  Complete Hysterectomy 1976 Cholecystectomy 1982 Left Kidney surgery to help rotate kidney 1980 Colon polyp removed 2006   Family History: Last updated: 05/29/2009 Mother Pancreatic cancer Brother 23 cerebral aneurysm Sister hyperlipidemia.  Social History: Last updated: 05/29/2009 REtired.  Married to [employer] with 2 daughters.   Former Smoker Quit 70s Alcohol use-no Drug use-no Regular exercise-yes  Risk Factors: Caffeine Use: 0 (02/29/2008) Exercise: yes (02/29/2008)  Risk Factors: Smoking Status: quit (02/29/2008)  Physical Exam  General:  Well-developed,well-nourished,in no acute distress; alert,appropriate and cooperative throughout examination Lungs:  Normal respiratory effort, chest expands symmetrically. Lungs are clear to auscultation, no  crackles or wheezes. Heart:  Normal rate and regular rhythm. S1 and S2 normal without gallop, murmur, click, rub or other extra sounds. Msk:  tender lower lumbar and sacral region right greater than left. Straight leg raise is negative Extremities:  no edema Neurologic:  alert & oriented X3, strength normal in all extremities, sensation intact to light touch, and DTRs symmetrical and normal.     Impression & Recommendations:  Problem # 1:  LUMBAGO (ICD-724.2) Assessment Deteriorated  refer neurosurgery with persistent pain not improved with conservative therapy. Refill Percocet for p.r.n. use. Cautious use of Naprosyn.  Orders: Neurosurgeon Referral (Neurosurgeon)  Her updated medication list for this problem includes:    Percocet 5-325 Mg Tabs (Oxycodone-acetaminophen) .Marland Kitchen... 1-2 by mouth q 6 hours as needed pain  Complete Medication List: 1)  Prevacid 30 Mg Cpdr (Lansoprazole) .... Once daily 2)  Est Estrogens-methyltest 1.25-2.5 Mg Tabs (Est estrogens-methyltest) .... Once daily 3)  Simvastatin 20 Mg Tabs (Simvastatin) .... Take 1 tablet by mouth once a day at bedtime 4)  Xanax 0.5 Mg Tabs (Alprazolam) .... One tab 60 minutes prior to procedule 5)  Percocet 5-325 Mg Tabs (Oxycodone-acetaminophen) .Marland Kitchen.. 1-2 by mouth q 6 hours as needed pain Prescriptions: PERCOCET 5-325 MG TABS (OXYCODONE-ACETAMINOPHEN) 1-2 by mouth q 6 hours as needed pain  #30 x 0   Entered and Authorized by:   Evelena Peat MD   Signed by:   Evelena Peat MD on 03/15/2010   Method used:   Print then Give to Patient   RxID:   [id number]    Orders Added: 1)  Neurosurgeon Referral [Neurosurgeon] 2)  Est. Patient Level III [14782]

## 2010-04-05 NOTE — Assessment & Plan Note (Signed)
Summary: LEG PAIN/NJR   Vital Signs:  Patient profile:   65 year old female Menstrual status:  hysterectomy Weight:      181 pounds BMI:     38.30 BP sitting:   120 / 80  (left arm)  Vitals Entered By: Kyung Rudd, CMA (February 27, 2010 2:09 PM) CC: rt leg pain x 1 wk   Primary Care Flavia Bruss:  Evelena Peat MD  CC:  rt leg pain x 1 wk.  History of Present Illness: Patient presents to clinic as a workin for evaluation of leg pain.  C/o right LBP that radiates to the ankle without paresthesia or weakness. No injury or trauma however may have noted initial onset of symptoms  ~3wks ago after lifting a child. OTC BC helps pain temporarily. Recently seen by outside Nazaret Chea and given flexeril, naprosyn and percocet as needed. Not taking the percocet. Feels some recent mild improvement. No other complaints.  Current Medications (verified): 1)  Prevacid 30 Mg Cpdr (Lansoprazole) .... Once Daily 2)  Est Estrogens-Methyltest 1.25-2.5 Mg Tabs (Est Estrogens-Methyltest) .... Once Daily 3)  Simvastatin 20 Mg Tabs (Simvastatin) .... Take 1 Tablet By Mouth Once A Day At Bedtime  Allergies (verified): 1)  ! Pcn 2)  ! Codeine 3)  ! Sulfa  Past History:  Past Medical History: Last updated: 05/29/2009 GERD Hyperlipidemia Colon polyps  Past Surgical History: Last updated: 02/29/2008 Cyst removed from the urinary tract by Dr. Donovan Kail.  Complete Hysterectomy 1976 Cholecystectomy 1982 Left Kidney surgery to help rotate kidney 1980 Colon polyp removed 2006   Family History: Last updated: 05/29/2009 Mother Pancreatic cancer Brother 51 cerebral aneurysm Sister hyperlipidemia.  Social History: Last updated: 05/29/2009 REtired.  Married to [employer] with 2 daughters.   Former Smoker Quit 70s Alcohol use-no Drug use-no Regular exercise-yes  Social History: Reviewed history from 05/29/2009 and no changes required. REtired.  Married to [employer] with 2 daughters.   Former  Smoker Quit 70s Alcohol use-no Drug use-no Regular exercise-yes  Review of Systems      See HPI  Physical Exam  General:  Well-developed,well-nourished,in no acute distress; alert,appropriate and cooperative throughout examination Head:  Normocephalic and atraumatic without obvious abnormalities. No apparent alopecia or balding. Eyes:  pupils equal, pupils round, pupils react to accomodation, and corneas and lenses clear.   Ears:  no external deformities.   Msk:  Gait nl. Able to wt bear and ambulate without assistance. + mild tenderness along midline lumbar spine without bony abnormality. No over paraspinal muscle spasm. +SLR right. Bilateral LE strength 5/5. Extremities:  No clubbing, cyanosis, edema, or deformity noted with normal full range of motion of all joints.   Neurologic:  alert & oriented X3, strength normal in all extremities, and gait normal.     Impression & Recommendations:  Problem # 1:  BACK PAIN, RIGHT (ICD-724.5) Suspect nerve impingement. Neurologically nonfocal. Recommend DC nsaid and begin prednisone taper. Obtain ls xray. Followup if no improvement or worsening.  Orders: T-Lumbar Spine Complete, 5 Views (71110TC)  Complete Medication List: 1)  Prevacid 30 Mg Cpdr (Lansoprazole) .... Once daily 2)  Est Estrogens-methyltest 1.25-2.5 Mg Tabs (Est estrogens-methyltest) .... Once daily 3)  Simvastatin 20 Mg Tabs (Simvastatin) .... Take 1 tablet by mouth once a day at bedtime 4)  Prednisone 10 Mg Tabs (Prednisone) .... 3 by mouth qdx 3 days then 2 by mouth once daily x 2 days then 1 by mouth once daily x2 days then 1/2 by mouth once daily  x 2 days Prescriptions: PREDNISONE 10 MG TABS (PREDNISONE) 3 by mouth qdx 3 days then 2 by mouth once daily x 2 days then 1 by mouth once daily x2 days then 1/2 by mouth once daily x 2 days  #16 x 0   Entered and Authorized by:   Edwyna Perfect MD   Signed by:   Edwyna Perfect MD on 02/27/2010   Method used:   Electronically  to        [facility] [phone]* (retail)       [address] [place], [place]  [address]       Ph: [phone] or [phone]       Fax: [phone]   RxID:   [id number]    Orders Added: 1)  T-Lumbar Spine Complete, 5 Views [71110TC] 2)  Est. Patient Level IV [38756]

## 2010-04-05 NOTE — Assessment & Plan Note (Signed)
Summary: leg pain/dm   Vital Signs:  Patient profile:   65 year old female Menstrual status:  hysterectomy Weight:      180 pounds Temp:     97.7 degrees F oral BP sitting:   130 / 88  (left arm) Cuff size:   large  Vitals Entered By: Sid Falcon LPN (March 01, 2010 1:17 PM)  History of Present Illness: Prior note reviewed.  RLE pain low back to R thigh. Onset about 3 weeks ago.  NSAIDS, muscle relaxers without relief. Started prednisone and developed headaches. Pain severe at times and 9/10 pain currently.  NKI. No numbness or weakness.  Pain worse with movement. Better with lying down.  Allergies: 1)  ! Pcn 2)  ! Codeine 3)  ! Sulfa  Past History:  Past Medical History: Last updated: 05/29/2009 GERD Hyperlipidemia Colon polyps  Past Surgical History: Last updated: 02/29/2008 Cyst removed from the urinary tract by Dr. Donovan Kail.  Complete Hysterectomy 1976 Cholecystectomy 1982 Left Kidney surgery to help rotate kidney 1980 Colon polyp removed 2006   Family History: Last updated: 05/29/2009 Mother Pancreatic cancer Brother 22 cerebral aneurysm Sister hyperlipidemia.  Social History: Last updated: 05/29/2009 REtired.  Married to [employer] with 2 daughters.   Former Smoker Quit 70s Alcohol use-no Drug use-no Regular exercise-yes  Risk Factors: Caffeine Use: 0 (02/29/2008) Exercise: yes (02/29/2008)  Risk Factors: Smoking Status: quit (02/29/2008) PMH-FH-SH reviewed for relevance  Review of Systems  The patient denies anorexia, fever, weight loss, abdominal pain, melena, hematochezia, severe indigestion/heartburn, hematuria, incontinence, and muscle weakness.    Physical Exam  General:  Well-developed,well-nourished,in no acute distress; alert,appropriate and cooperative throughout examination Lungs:  Normal respiratory effort, chest expands symmetrically. Lungs are clear to auscultation, no crackles or wheezes. Heart:  Normal rate and  regular rhythm. S1 and S2 normal without gallop, murmur, click, rub or other extra sounds. Extremities:  no edema.  SLR equivocal on R and neg L.  Good distal pulses. Neurologic:  strength normal in all extremities and DTRs symmetrical and normal.     Impression & Recommendations:  Problem # 1:  BACK PAIN, RIGHT (ICD-724.5)  nonfocal neuro.  Pt with ?intolerance to oral pred.  Will try depomedrol 80 mg and reassess 2 weeks. Percocet for pain control (she already has rx from ER).  Consider MRI if no better by then.  Orders: Depo- Medrol 80mg  (J1040) Admin of Therapeutic Inj  intramuscular or subcutaneous (16109)  Complete Medication List: 1)  Prevacid 30 Mg Cpdr (Lansoprazole) .... Once daily 2)  Est Estrogens-methyltest 1.25-2.5 Mg Tabs (Est estrogens-methyltest) .... Once daily 3)  Simvastatin 20 Mg Tabs (Simvastatin) .... Take 1 tablet by mouth once a day at bedtime  Patient Instructions: 1)  Please schedule a follow-up appointment in 2 weeks.    Medication Administration  Injection # 1:    Medication: Depo- Medrol 80mg     Diagnosis: BACK PAIN, RIGHT (ICD-724.5)    Route: IM    Site: LUOQ gluteus    Exp Date: 09/01/2012    Lot #: [id number]    Mfr: [employer]    Patient tolerated injection without complications    Given by: Sid Falcon LPN (March 01, 2010 2:12 PM)  Orders Added: 1)  Est. Patient Level III [09811] 2)  Depo- Medrol 80mg  [J1040] 3)  Admin of Therapeutic Inj  intramuscular or subcutaneous [91478]

## 2010-04-05 NOTE — Progress Notes (Signed)
Summary: results needed X-rays  Phone Note Call from Patient Call back at Home Phone [phone] Call back at Work Phone [phone]   Caller: Patient---live call Reason for Call: Lab or Test Results Summary of Call: needs xray results Initial call taken by: Warnell Forester,  February 28, 2010 2:33 PM  Follow-up for Phone Call        pt cb Follow-up by: Heron Sabins,  February 28, 2010 4:25 PM  Additional Follow-up for Phone Call Additional follow up Details #1::        no fractures or bony masses Additional Follow-up by: Edwyna Perfect MD,  March 01, 2010 7:44 AM    Additional Follow-up for Phone Call Additional follow up Details #2::    just saw her 12/27 and placed on prednisone. not enough time yet to see if medicine is helping. if needs pain med can call in ultram 50mg  by mouth q6 hours as needed pain #20 Follow-up by: Edwyna Perfect MD,  March 01, 2010 9:57 AM  Additional Follow-up for Phone Call Additional follow up Details #3:: Details for Additional Follow-up Action Taken: appt with Dr. Caryl Never at 1:15pm today Additional Follow-up by: Kyung Rudd, CMA,  March 01, 2010 10:16 AM  pt aware but notes that she is still in pain...please advise

## 2010-04-09 ENCOUNTER — Telehealth: Payer: Self-pay | Admitting: Family Medicine

## 2010-04-09 NOTE — Telephone Encounter (Signed)
Pt went to Dr Phoebe Perch for inj in back. Pt now has pain in thigh. Pt is afraid that she has a blood clot in her leg. Pt says there is a lot of pain. Pt is req to get a referral to specialist for her leg.

## 2010-04-09 NOTE — Telephone Encounter (Signed)
Pt needs to be evaluated.  We can set up venous doppler if any suspicion for clot.

## 2010-04-10 ENCOUNTER — Encounter: Payer: Self-pay | Admitting: Family Medicine

## 2010-04-10 ENCOUNTER — Ambulatory Visit (INDEPENDENT_AMBULATORY_CARE_PROVIDER_SITE_OTHER): Payer: BC Managed Care – PPO | Admitting: Family Medicine

## 2010-04-10 VITALS — BP 130/80 | Temp 98.1°F | Ht <= 58 in | Wt 180.0 lb

## 2010-04-10 DIAGNOSIS — M545 Low back pain, unspecified: Secondary | ICD-10-CM

## 2010-04-10 DIAGNOSIS — M79609 Pain in unspecified limb: Secondary | ICD-10-CM

## 2010-04-10 DIAGNOSIS — M79659 Pain in unspecified thigh: Secondary | ICD-10-CM

## 2010-04-10 DIAGNOSIS — E785 Hyperlipidemia, unspecified: Secondary | ICD-10-CM

## 2010-04-10 NOTE — Progress Notes (Signed)
  Subjective:    Patient ID: Alison Garza, female    DOB: 1945/12/26, 65 y.o.   MRN: [id number]  HPI  Patient seen with right mostly posterior thigh pain for the past couple months or so. Recently presented with severe low back pain. MRI scan of lumbar spine performed and recently saw a neurosurgeon. Epidural injection last week with some improvement in back pain. Right thigh pain has persisted and worsened.  Described as achy to sore quality. 10 out 10 severity at worst. Worse with movement and improved at rest. Some improvement with heat and ice. No associated edema. Patient concerned about blood clot risk. No prior history of DVT. Does have family history of DVT in a daughter. Patient also maintained on high-dose estrogen per gynecologist since 1976. Nonsmoker. No history of known hereditary coagulopathy.  Sedentary lifestyle.  Her back pain is improved Denies any urine or stool incontinence. No lower extremity numbness. No claudication-type symptoms. No recent chest pains or shortness of breath Review of Systems Patient denies any fever, chills, dysuria, progressive lower extremity weakness, urine or stool incontinence, or any rashes    Objective:   Physical Exam    patient is alert in no distress. Pharynx is clear Neck is supple no masses and no thyromegaly Chest clear to auscultation Heart regular rhythm and rate Extremities no pitting edema. Distal foot pulses are 2+ dorsalis pedis. No visible color changes lower extremities Both feet are warm intact with good capillary refill.  Femoral pulses are strong and palpated bilateral. Minimal tenderness right calf and right thigh region. No palpable masses No asymmetric edema of legs. Restriction internal and external range of motion right hip compared to left Neuro exam is nonfocal weakness. Symmetric reflexes    Assessment & Plan:  #1  thigh pain. Patient very concerned about possibility of venous clot. Clinical suspicion low.  She does have risk factors of age, family history, and high-dose estrogen use. #2  low back pain slightly improved after recent epidural injection #3  Hyperlipidemia  Setup venous Doppler scan right lower extremity. If negative consider hip x-rays to rule out significant osteoarthritis right hip

## 2010-04-10 NOTE — Patient Instructions (Signed)
We will call you regarding venous doppler exam 

## 2010-04-10 NOTE — Telephone Encounter (Signed)
Pt scheduled for today.  

## 2010-04-11 NOTE — Consult Note (Signed)
Summary: [facility]  [facility]   Imported By: Maryln Gottron 04/03/2010 10:26:34  _____________________________________________________________________  External Attachment:    Type:   Image     Comment:   External Document

## 2010-04-13 ENCOUNTER — Encounter (INDEPENDENT_AMBULATORY_CARE_PROVIDER_SITE_OTHER): Payer: BC Managed Care – PPO

## 2010-04-13 ENCOUNTER — Encounter: Payer: Self-pay | Admitting: Family Medicine

## 2010-04-13 DIAGNOSIS — M79609 Pain in unspecified limb: Secondary | ICD-10-CM | POA: Insufficient documentation

## 2010-04-19 NOTE — Miscellaneous (Signed)
Summary: Orders Update  Clinical Lists Changes  Problems: Added new problem of LEG PAIN (ICD-729.5) Orders: Added new Test order of Venous Duplex Lower Extremity (Venous Duplex Lower) - Signed 

## 2010-07-02 ENCOUNTER — Other Ambulatory Visit: Payer: Self-pay | Admitting: Family Medicine

## 2010-07-02 MED ORDER — SIMVASTATIN 20 MG PO TABS: 20.0000 mg | ORAL_TABLET | Freq: Every day | ORAL | Status: DC

## 2010-07-02 NOTE — Telephone Encounter (Signed)
Pt needs new rx simvastatin 20mg  call into [facility] main [phone]

## 2010-07-02 NOTE — Telephone Encounter (Signed)
Refill for 6 months. 

## 2010-07-02 NOTE — Telephone Encounter (Signed)
Rx sent to [facility]

## 2010-07-17 NOTE — Assessment & Plan Note (Signed)
[facility]                         GASTROENTEROLOGY OFFICE NOTE   LUX, SKILTON                      MRN:          [id number]  DATE:02/17/2007                            DOB:          [birth year]    HISTORY:  This is a 65 year old female with chronic abdominal  complaints. She does have a history of reflux disease, small gastric  ulcer and adenomatous colon polyps. She was evaluated in the office  approximately one year ago for chronic abdominal complaints which were  felt to be functional. She is status post cholecystectomy. Screening  laboratories last year were negative as was an abdominal ultrasound and  upper endoscopy. She tells me that she has had severe abdominal pain  daily, constantly for a couple of months. This despite b.i.d. Nexium. No  heartburn or indigestion. She states that the discomfort is worse with  meals. She has been constipated. She is worried that she has a recurrent  ulcer. She is also worried about her colon issues. She is due for a  surveillance colonoscopy this year. She denies weight loss. She has  actually had weight gain. No bleeding. She denies stress or depression.   ALLERGIES:  PENICILLIN, CODEINE AND SULFA.   CURRENT MEDICATIONS:  1. Lipitor.  2. Detrol LA.  3. Hormone replacement.  4. Nexium.   PHYSICAL EXAMINATION:  Finds an anxious female in no acute distress. She  is alert and oriented. Blood pressure 128/82, heart rate 68, weight  177.0 pounds (increased 1.4 pounds).  HEENT: Sclerae anicteric. Conjunctivae are pink. Oral mucosa intact. No  adenopathy.  LUNGS:  Are clear.  HEART: Is regular.  ABDOMEN: Mildly obese and soft with complaints of tenderness with mild  palpation throughout. Good bowel sounds heard.  EXTREMITIES: Without edema.   IMPRESSION:  1. Several month history of epigastric pain despite the b.i.d. Nexium.      The patient does have a history of ulcer disease. She was on some     nonsteroidal anti-inflammatories a few months ago with knee      surgery. Rule out recurrent ulcer. However, she does have abdominal      wall tenderness which is in part musculoskeletal. I also suspect      she does have a functional component.  2. Chronic constipation.  3. History of adenomatous colon polyps due for surveillance.   RECOMMENDATIONS:  Schedule colonoscopy and upper endoscopy to evaluate  pain, provide neoplasia surveillance, and guide further therapy. The  nature of the procedure as well as the risks, benefits and alternatives  have been reviewed. She understood and agreed to proceed. If  the examinations are unrevealing, then the patient may benefit from  therapies that may alter visceral pain such as selective serotonin  reuptake inhibitors.     Wilhemina Bonito. Marina Goodell, MD  Electronically Signed    JNP/MedQ  DD: 02/17/2007  DT: 02/17/2007  Job #: [id number]   cc:   Quita Skye. Artis Flock, M.D.

## 2010-07-17 NOTE — Op Note (Signed)
Alison Garza, Alison Garza NO.:  [id number]   MEDICAL RECORD NO.:  [id number]          PATIENT TYPE:  INP   LOCATION:  9311                          FACILITY:  [facility]   PHYSICIAN:  Miguel Aschoff, M.D.       DATE OF BIRTH:  06/09/1945   DATE OF PROCEDURE:  07/16/2007  DATE OF DISCHARGE:                               OPERATIVE REPORT   PREOPERATIVE DIAGNOSES:  1. Pelvic pain.  2. Cystic pelvic mass.   POSTOPERATIVE DIAGNOSES:  1. 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  2. Extensive omental adhesions and interloop bowel adhesions.  3. Multiple adhesions in cul-de-sac.   SURGEON:  Miguel Aschoff, MD   ASSISTANT:  Leonie Man, MD   PROCEDURES:  1. Exploratory laparotomy.  2. Lysis of adhesions.  3. Excision of peritoneal cyst.   COMPLICATIONS:  None.   JUSTIFICATION:  The patient is a 65 year old black female who had  previous cholecystectomy and previous total abdominal hysterectomy and  bilateral salpingo-oophorectomy.  The patient has been complaining of  persistent right lower quadrant pain and underwent outpatient evaluation  which revealed a cystic pelvic mass in the right lower quadrant near the  cecum in the follow-up ultrasound, and CT scan done on January 16, 2007, revealed a 5 x 2 x 3-cm cystic mass.  Because of the patient's  persistent pain, the abnormal findings on CT scan and ultrasound, she  presents now to undergo exploratory laparotomy to identify the nature of  this mass and remove it as well as look for any other sources of pelvic  pain.  The risks and benefits of this procedure were discussed with the  patient as well as the possibility of recurrence of adhesions following  the surgery.   PROCEDURE:  The patient was taken to the operating room, placed in the  supine position.  General anesthesia was administered without  difficulty.  Foley catheter was placed.  After this was done, she was  prepped and draped in sterile fashion.  A  midline incision was made  extending from the symphysis pubis to the umbilicus.  This was extended  up to the subcutaneous tissue with bleeding points being clamped and  coagulated that were encountered.  Once this was done, the fascia was  identified.  Residual Mersilene suture material was noted in the fascial  layer.  The fascia was then opened vertically.  After this was done, the  rectus muscles were divided in the midline.  Peritoneum was found and  entered.  On entering the peritoneum, it was obvious that the patient  had extensive adhesions of the omentum to the anterior abdominal wall.  At this point of the procedure consented herself with removing the  omentum off the abdominal wall, so the exploration of the abdomen to be  carried out.  These extensive adhesions were then taken down and it was  possible to free the omentum completely from the intra-abdominal wall  with good hemostasis.  At this point, inspection of the bowel revealed  multiple interloop adhesions involving the small bowel.  These were  stuck to the peritoneal surfaces,  to the omentum, and to the cul-de-sac.  The cul-de-sac was also obliterated with adhesions of the sigmoid colon  and small bowel adhesions.  With great care, it was possible to lyse all  the interloop bowel adhesions and to restore the anatomy to normal  configuration with all adhesions being taken down.  The cul-de-sac was  then freed of the adhesions and the sigmoid colon released from the cul-  de-sac as well as the adhesions of the small bowel in this area.  After  freeing these adhesions, it was noted in the right lower quadrant near  the cecum there was a 5-cm cystic mass that did not appear to be  attached to any organ.  It was felt that this possibly represented a  peritoneal cyst.  It was possible to excise this mass without  difficulty.  The mass was again about 5 x 2 x 3 cm in size and appeared  be completely cystic.  This was removed  and sent for histologic  evaluation.  The procedure then continued again with efforts to restore  the bowel to normal configuration by taking down all adhesions.  After  this was done with the assistance of Dr. Lurene Shadow, the bowel was run from  the ligament of Treitz through to sigmoid colon to ensure that all  interloop bowel adhesions had been lysed and this was done without  difficulty and with good hemostasis.  Once this was done, an effort to  prevent further adhesions from reforming, intercede was placed between  the rectum and the cul-de-sac and the bowel was allowed to return to its  normal position.  At this point, lap counts and instrument counts were  taken down to be correct and then the abdomen was closed.  In effort to  prevent anterior wall adhesions, intercede was placed over the omentum  to try to protect it from adhering once again to the anterior abdominal  wall.  Once this was done, the abdomen was closed using the 0 double PDS  suture to close the fascia.  Once the fascia was closed, the wound was  irrigated with saline and then the incision was closed further with  staples.  Dressing was applied.  The patient reversed from the  anesthetic and taken to recovery room in satisfactory condition.  The  estimated blood loss was less than 100 mL.  The patient tolerated the  procedure well.   The plan is for the patient to be admitted to the [facility] for  postoperative care.      Miguel Aschoff, M.D.  Electronically Signed     AR/MEDQ  D:  07/16/2007  T:  07/16/2007  Job:  [id number]   cc:   Leonie Man, M.D.  [address]  [address]  [place]  [place] [address]   Quita Skye. Artis Flock, M.D.  Fax: [phone]

## 2010-07-19 ENCOUNTER — Telehealth: Payer: Self-pay | Admitting: *Deleted

## 2010-07-19 NOTE — Telephone Encounter (Signed)
Pt called triage [name], questioning when she should have her cholesterol checked again?

## 2010-07-20 NOTE — Telephone Encounter (Signed)
Repeat in 3 months

## 2010-07-20 NOTE — Telephone Encounter (Signed)
Pt informed

## 2010-07-20 NOTE — Discharge Summary (Signed)
NAMEKEVIA, Alison Garza               ACCOUNT NO.:  [id number]   MEDICAL RECORD NO.:  [id number]          PATIENT TYPE:  INP   LOCATION:  9311                          FACILITY:  [facility]   PHYSICIAN:  Miguel Aschoff             DATE OF BIRTH:  1946/01/03   DATE OF ADMISSION:  07/16/2007  DATE OF DISCHARGE:  07/19/2007                               DISCHARGE SUMMARY   ADMISSION DIAGNOSES:  1. Chronic pelvic and abdominal pain.  2. Cystic abdominal mass.   POSTOP DIAGNOSES:  1. Massive intra-abdominal adhesions.  2. Peritoneal cyst with cystic endosalpingiosis.   BRIEF HISTORY:  The patient is a 65 year old black female with a history  of multiple prior laparotomies.  The patient has persistent constant  abdominal and lower abdominal pain that has not responded to outpatient  evaluation or treatment.  The patient underwent a CT scan to assess the  etiology of her pain.  The only finding at the time of the CT that was  remarkable was a cystic mass in the right lower quadrant that was  approximately 5-6 cm in size in the area adjacent to the appendix.  The  patient had a prior history of a hysterectomy and bilateral salpingo-  oophorectomy.  Because of her symptomatology and the cystic mass, it was  felt that operative therapy was indicated.  The patient was informed of  the options of treatment and gave informed consent for exploratory  laparotomy.   HOSPITAL COURSE:  The patient was admitted, preoperative studies were  obtained, these included admission hemoglobin of 15.1, hematocrit of 45,  white count of 11,000, PT/PTT within normal limits, chemistry profile  was essentially unremarkable, and her urinalysis was negative.  On Jul 16, 2007, under general anesthesia, an exploratory laparotomy was  carried out.  At the time of surgery, the patient was noted to have a  massive amount of intra-abdominal adhesions with the omentum adherent  from just above the umbilicus down to the pelvis. It  was adherent to the  anterior abdominal wall as well as to the structures of the pelvis and  cul-de-sac.  In addition, she had extensive intra-abdominal adhesions  involving her small and large intestines.  In addition to the adhesions,  a mass was found in the right lower quadrant, cystic in nature, and  approximately 5 cm in size, and at the time of sinus surgery, this was  removed and sent for histologic study.  Surgical procedure was done with  all adhesions visible now lysed and effort was made to prevent further  adhesions by using Intercede at the time of her abdominal closure.  The  patient's postoperative course was essentially uncomplicated.  She  slowly had a return of bowel function.  She was able to ambulate well,  and by Jul 19, 2007, she was sent home in satisfactory condition on a  regular diet.   Her medications at home included Motrin 600 mg p.o. q.6 h. as needed for  pain.  She was instructed no heavy lifting; to call for any problems  such as fever, pain or heavy bleeding; and to return to the office in 1  week for removal of her staples.   FINAL DIAGNOSES:  1. Massive intra-abdominal adhesions.  2. Abdominal cystic mass.  3. Cystic endosalpingiosis.   The patient was sent home on a regular diet in satisfactory condition.      Miguel Aschoff, M.D.  Electronically Signed     ______________________________  Miguel Aschoff    AR/MEDQ  D:  08/16/2007  T:  08/16/2007  Job:  [id number]

## 2010-07-20 NOTE — Op Note (Signed)
[name], Alison Garza               ACCOUNT NO.:  [id number]   MEDICAL RECORD NO.:  [id number]          PATIENT TYPE:  AMB   LOCATION:  DSC                          FACILITY:  [facility]   PHYSICIAN:  Mila Homer. Sherlean Foot, M.D. DATE OF BIRTH:  08/30/1945   DATE OF PROCEDURE:  07/31/2005  DATE OF DISCHARGE:                                 OPERATIVE REPORT   SURGEON:  Mila Homer. Sherlean Foot, M.D.   ASSISTANT:  None.   ANESTHESIA:  MAC.   PREOPERATIVE DIAGNOSIS:  Right knee lateral meniscus tear and  osteoarthritis.   POSTOPERATIVE DIAGNOSIS:  Right knee lateral meniscus tear and  osteoarthritis.   PROCEDURE:  Right knee arthroscopy with partial lateral meniscectomy,  lateral compartment chondroplasty, and plica resection.   INDICATIONS FOR PROCEDURE:  Patient is a 65 year old black female with  mechanical symptoms and MRI evidence of a lateral meniscus tear and OA.  Informed consent was obtained.   DESCRIPTION OF PROCEDURE:  Patient was laid supine and administered general  anesthesia.  The right knee was prepped and draped in the usual sterile  fashion.  The inferolateral and inferomedial portals were created with a #11  blade, blunt trocar, and cannula.  Diagnostic arthroscopy revealed a large  lateral meniscus tear, which was debrided with a straight and up-biting  basket forceps and a great white shaver.  The medial compartment was  essentially normal, as was the patellofemoral compartment.  The lateral  tibial plateau had grade 4 chondromalacia over a 1 x 2 cm area.  A  chondroplasty was performed here.  I then went into the patellofemoral joint  and debrided the medial parapatellar plica, which was quite large and  __________.  I then lavaged and closed with 4-0 nylon suture, dressed with  Adaptic, 4x4, sterile Webril, and Ace wrap.   COMPLICATIONS:  None.   DRAINS:  None.           ______________________________  Mila Homer. Sherlean Foot, M.D.     SDL/MEDQ  D:  07/31/2005  T:   07/31/2005  Job:  [id number]

## 2010-07-20 NOTE — Assessment & Plan Note (Signed)
[facility]                         GASTROENTEROLOGY OFFICE NOTE   TONESHIA, COELLO                      MRN:          [id number]  DATE:03/24/2006                            DOB:          June 15, 1945    HISTORY:  The patient presents today for followup. She was initially  evaluated February 14, 2006, as a new patient regarding chronic  abdominal complaints. See that dictation for details. Screening  laboratories were obtained and returned unremarkable. As well, an  abdominal ultrasound was unremarkable. Upper endoscopy was unremarkable.  There was no evidence of recurrent ulcer disease. She continued on  Nexium 40 mg b.i.d. she was prescribed Librax, which she has used though  reports side effect of intolerable somnolence. I was also able to obtain  old records from Dr. Tasia Catchings regarding her prior endoscopies and  colonoscopies. Her colonoscopies are up-to-date and she will be due for  surveillance around July of 2009. She was terribly concerned over the  presence of a dimunitive renal cyst on her ultrasound that she saw her  urologist and primary care physician. They provided reassurance. In  terms of current symptoms, she reports complaints of nausea with meals,  abdominal complaints continue. She appears depressed. I asked her about  depression or other issues. She states that she has been under a great  deal of stress with significant issues at work. As well, some stress at  home related to possibly selling her home. No new problems. I reviewed  in great detail all of her blood work and tests.   CURRENT MEDICATIONS:  1. Lipitor.  2. Detrol LA.  3. Hormone replacement.  4. Nexium b.i.d.   PHYSICAL EXAMINATION:  Depressed, but otherwise well-appearing female in  no acute distress. Blood pressure is 92/60, heart rate is 65 and  regular. Weight is 175.6 pounds (no change).  The abdomen was not reexamined.   IMPRESSION:  1. Chronic  functional abdominal complaints exacerbated by anxiety and      stress.  2. History of reflux disease.  3. History of adenomatous colon polyps.  4. General medical problems.   RECOMMENDATIONS:  1. Reassurance provided.  2. If stress and symptoms persist, consider antidepressant therapy      under the guidance of Dr. Artis Flock. The patient understands this      recommendation.  3. For classic GERD symptoms, decrease Nexium to 40 mg daily. A      prescription with multiple refills has been provided.  4. The patient should be referred back for surveillance colonoscopy      around July of 2009. She      was advised.  5. Return to general medical care of Dr. Artis Flock.     Wilhemina Bonito. Marina Goodell, MD  Electronically Signed    JNP/MedQ  DD: 03/24/2006  DT: 03/24/2006  Job #: [id number]   cc:   Quita Skye. Artis Flock, M.D.  [employer], M.D.

## 2010-07-20 NOTE — Assessment & Plan Note (Signed)
[facility]                         GASTROENTEROLOGY OFFICE NOTE   MAZIAH, SMOLA                      MRN:          [id number]  DATE:02/14/2006                            DOB:          1945-04-16    REFERRING PHYSICIAN:  Dr. Bradd Canary.   REASON FOR CONSULTATION:  Abdominal complaints.   HISTORY:  This is a 65 year old African-American female with a history  of hyperlipidemia, reflux disease, and peptic ulcer disease, who was  referred through the courtesy of Dr. Artis Flock regarding abdominal  complaints.  The patient is a poor historian.  She apparently has had  chronic abdominal complaints for years.  She tells me that she had been  taken care of previously by Dr. Tasia Catchings.  Her evaluations have  included colonoscopy on 2 occasions, as well as prior upper endoscopy.  She tells me that she was diagnosed with a gastric ulcer.  She also  tells me she has a history of diverticulitis.  She is status post  cholecystectomy.  Her complaints over the past 4 to 5 months include  worsening indigestion, bloating, and abdominal discomfort.  The  abdominal discomfort is mostly in the right upper quadrant.  She has  nausea and regurgitation, though no vomiting.  She states she has  symptoms despite b.i.d. Nexium.  She also has chronic stable  constipation, for which she takes milk of magnesia or prunes.  She is  satisfied with her bowel habits though.  She has had weight gain.  She  uses non-steroidal anti-inflammatory agents intermittently for  arthritis.  These seem to cause GI distress.  She also reports that her  mother had a history of pancreatic cancer, which concerns her.   PAST MEDICAL HISTORY:  As above.   PAST SURGICAL HISTORY:  1. Cholecystectomy.  2. Hysterectomy.  3. Tubal ligation.  4. Appendectomy.  5. Knee surgery.   ALLERGIES:  1. PENICILLIN.  2. CODEINE.  3. SULFA.   CURRENT MEDICATIONS:  Lipitor 20 mg daily, Detrol LA 4  mg daily, hormone  replacement, and Nexium 40 mg b.i.d.   FAMILY HISTORY:  No family history of colon or gastric cancer.  Mother  with pancreatic cancer.   SOCIAL HISTORY:  The patient is married with 2 daughters.  She lives  with her husband.  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  She does not smoke or use alcohol.   REVIEW OF SYSTEMS:  Per diagnostic evaluation form.   PHYSICAL EXAM:  Anxious female in no acute distress.  Blood pressure is 110/80, heart rate is 76 and regular.  Weight is 176  pounds.  She is 5 feet in height.  HEENT:  Sclerae anicteric.  Conjunctivae pink.  Oral mucosa intact.  No  adenopathy.  LUNGS:  Clear.  HEART:  Regular.  ABDOMEN:  Obese and soft without significant tenderness.  No mass,  organomegaly, or hernia.  EXTREMITIES:  Without edema.   IMPRESSION:  Multiple abdominal complaints in a 65 year old female who  apparently has a history of ulcer disease, colon polyps, and  reflux.  Current symptoms persisting despite b.i.d. Nexium.  She is status post  cholecystectomy.  No worrisome features such as vomiting, bleeding or  weight loss.  Suspect significant functional component to symptoms.   RECOMMENDATIONS:  1. Screening laboratories including CBC, comprehensive metabolic      panel, amylase, and lipase.  2. Schedule abdominal ultrasound.  3. Diagnostic upper endoscopy, rule out recurrent ulcer disease.  4. Continue Nexium.  5. Reflux precautions with attention to weight loss.  6. Obtain old records from Dr. Sherin Quarry for review.  7. Ongoing general medical care with Dr. Artis Flock.     Wilhemina Bonito. Marina Goodell, MD  Electronically Signed    JNP/MedQ  DD: 02/16/2006  DT: 02/16/2006  Job #: [id number]   cc:   Quita Skye. Artis Flock, M.D.

## 2010-07-25 ENCOUNTER — Telehealth: Payer: Self-pay | Admitting: Family Medicine

## 2010-07-25 MED ORDER — OMEPRAZOLE MAGNESIUM 20 MG PO TBEC: 20.0000 mg | DELAYED_RELEASE_TABLET | Freq: Every day | ORAL | Status: DC

## 2010-07-25 NOTE — Telephone Encounter (Signed)
Lansoprazole (Capsule Delayed Release) PREVACID has high payment with [facility]. Can she get Omeprazale? If so can she get it faxed to [phone]..[facility] Call pt with any questions.

## 2010-07-25 NOTE — Telephone Encounter (Signed)
Yes.  Let's switch to Omeprazole 20 mg daily.

## 2010-07-25 NOTE — Telephone Encounter (Signed)
Please advise 

## 2010-08-01 ENCOUNTER — Telehealth: Payer: Self-pay | Admitting: *Deleted

## 2010-08-01 MED ORDER — OMEPRAZOLE MAGNESIUM 20 MG PO TBEC: 20.0000 mg | DELAYED_RELEASE_TABLET | Freq: Every day | ORAL | Status: DC

## 2010-08-01 NOTE — Telephone Encounter (Signed)
Change Prilosec to Omeprazole

## 2010-08-23 ENCOUNTER — Ambulatory Visit (INDEPENDENT_AMBULATORY_CARE_PROVIDER_SITE_OTHER): Payer: Medicare HMO | Admitting: Internal Medicine

## 2010-08-23 ENCOUNTER — Encounter: Payer: Self-pay | Admitting: Internal Medicine

## 2010-08-23 DIAGNOSIS — R109 Unspecified abdominal pain: Secondary | ICD-10-CM

## 2010-08-23 DIAGNOSIS — R1030 Lower abdominal pain, unspecified: Secondary | ICD-10-CM

## 2010-08-23 DIAGNOSIS — N318 Other neuromuscular dysfunction of bladder: Secondary | ICD-10-CM

## 2010-08-23 DIAGNOSIS — M549 Dorsalgia, unspecified: Secondary | ICD-10-CM

## 2010-08-23 LAB — POCT URINALYSIS DIPSTICK
Ketones, UA: NEGATIVE
Nitrite, UA: NEGATIVE
Protein, UA: NEGATIVE
Urobilinogen, UA: 0.2
pH, UA: 7.5

## 2010-08-23 MED ORDER — CIPROFLOXACIN HCL 500 MG PO TABS: 500.0000 mg | ORAL_TABLET | Freq: Two times a day (BID) | ORAL | Status: AC

## 2010-08-23 NOTE — Patient Instructions (Signed)
Drink as much fluid as you  can tolerate over the next few days  Take your antibiotic as prescribed until ALL of it is gone, but stop if you develop a rash, swelling, or any side effects of the medication.  Contact our office as soon as possible if  there are side effects of the medication.  Call or return to clinic prn if these symptoms worsen or fail to improve as anticipated.   

## 2010-08-23 NOTE — Progress Notes (Signed)
  Subjective:    Patient ID: Alison Garza, female    DOB: 06/28/1945, 65 y.o.   MRN: [id number]  HPI  65 year old patient who presents with a four-day history of abdominal pai;  there is some radiation of the pain to the right back area. She has a remote history of diverticulitis and also has a history of IBS and overactive bladder. She has been using the over-the-counter bladder anesthetic. She does have some mild dysuria and frequency;  urinalysis did reveal trace leukocytes and trace blood. No fever or chills  Review of Systems  Constitutional: Negative.   HENT: Negative for hearing loss, congestion, sore throat, rhinorrhea, dental problem, sinus pressure and tinnitus.   Eyes: Negative for pain, discharge and visual disturbance.  Respiratory: Negative for cough and shortness of breath.   Cardiovascular: Negative for chest pain, palpitations and leg swelling.  Gastrointestinal: Positive for abdominal pain. Negative for nausea, vomiting, diarrhea, constipation, blood in stool and abdominal distention.  Genitourinary: Positive for dysuria. Negative for urgency, frequency, hematuria, flank pain, vaginal bleeding, vaginal discharge, difficulty urinating, vaginal pain and pelvic pain.  Musculoskeletal: Positive for back pain. Negative for joint swelling, arthralgias and gait problem.  Skin: Negative for rash.  Neurological: Negative for dizziness, syncope, speech difficulty, weakness, numbness and headaches.  Hematological: Negative for adenopathy.  Psychiatric/Behavioral: Negative for behavioral problems, dysphoric mood and agitation. The patient is not nervous/anxious.        Objective:   Physical Exam  Constitutional: She appears well-developed and well-nourished. No distress.  Abdominal: Soft. Bowel sounds are normal. She exhibits no distension. There is tenderness. There is no rebound and no guarding.       Mild tenderness in the mid abdominal area most marked over the suprapubic area  but also present involving the right lower quadrant          Assessment & Plan:   Abdominal pain. Probable UTI. Will treat with a seven-day round of Cipro. She will call if not improved

## 2010-08-28 ENCOUNTER — Ambulatory Visit (INDEPENDENT_AMBULATORY_CARE_PROVIDER_SITE_OTHER): Payer: Medicare HMO | Admitting: Family Medicine

## 2010-08-28 ENCOUNTER — Encounter: Payer: Self-pay | Admitting: Family Medicine

## 2010-08-28 VITALS — BP 122/78 | HR 74 | Temp 98.1°F | Wt 171.0 lb

## 2010-08-28 DIAGNOSIS — R1013 Epigastric pain: Secondary | ICD-10-CM

## 2010-08-28 LAB — CBC WITH DIFFERENTIAL/PLATELET
Basophils Absolute: 0 10*3/uL (ref 0.0–0.1)
HCT: 44 % (ref 36.0–46.0)
Lymphs Abs: 3.5 10*3/uL (ref 0.7–4.0)
Monocytes Relative: 5 % (ref 3.0–12.0)
Neutrophils Relative %: 46.9 % (ref 43.0–77.0)
Platelets: 180 10*3/uL (ref 150.0–400.0)
RDW: 13.7 % (ref 11.5–14.6)

## 2010-08-28 LAB — AMYLASE: Amylase: 68 U/L (ref 27–131)

## 2010-08-28 LAB — HEPATIC FUNCTION PANEL
Albumin: 3.8 g/dL (ref 3.5–5.2)
Alkaline Phosphatase: 49 U/L (ref 39–117)
Total Bilirubin: 0.8 mg/dL (ref 0.3–1.2)

## 2010-08-28 NOTE — Progress Notes (Signed)
  Subjective:    Patient ID: Alison Garza, female    DOB: [birth year], 65 y.o.   MRN: [id number]  HPI Followup abdominal pain. Refer to her last visit. Question of possible UTI. Patient on Cipro with no improvement in abdominal pain. Denies any burning with urination but has some mild frequency. She had little over one week history of epigastric pain. Achy quality. Moderate severity. No alleviating factors. She has some nausea but no vomiting. Pain worse after eating. She increased Prilosec to 2 daily last week without improvement. Intermittent sweats but no definite fever. Past history of reported remote peptic ulcer disease and diverticulitis.  Patient had some recent mild weight loss which she attributes to her efforts. No hematemesis. No melena. Previous cholecystectomy. Previous appendectomy. Does take occasional Aleve and aspirin. No alcohol use. No history of pancreatitis.   Review of Systems  Constitutional: Positive for diaphoresis, appetite change and fatigue. Negative for fever and chills.  Respiratory: Negative for cough and shortness of breath.   Cardiovascular: Negative for chest pain, palpitations and leg swelling.  Gastrointestinal: Positive for nausea and abdominal pain. Negative for vomiting, diarrhea, constipation, blood in stool, abdominal distention and rectal pain.  Genitourinary: Positive for frequency. Negative for dysuria.  Musculoskeletal: Negative for back pain.       Objective:   Physical Exam  Constitutional: She is oriented to person, place, and time. She appears well-developed and well-nourished.  Neck: Neck supple.  Cardiovascular: Normal rate, regular rhythm and normal heart sounds.   Pulmonary/Chest: Effort normal and breath sounds normal. No respiratory distress. She has no wheezes.  Abdominal: Soft. She exhibits no mass. There is tenderness. There is no rebound and no guarding.       Epigastric tenderness. No organomegaly noted. No guarding or rebound.    Musculoskeletal: She exhibits no edema.  Neurological: She is alert and oriented to person, place, and time.  Psychiatric: She has a normal mood and affect. Her behavior is normal.          Assessment & Plan:  Abdominal pain epigastric area. Differential includes gastritis, peptic ulcer disease, less likely pancreatitis. Discontinue Aleve and aspirin. Upper GI ordered. Obtain labs of CBC, amylase, and hepatic panel. Consider GI referral. Continue Prilosec 2 daily.  Avoidance of ALL NSAIDS.

## 2010-08-28 NOTE — Patient Instructions (Signed)
Follow up immediately for any fever, worsening abdominal pain, or any new symptoms.

## 2010-08-30 ENCOUNTER — Telehealth: Payer: Self-pay | Admitting: *Deleted

## 2010-08-30 NOTE — Telephone Encounter (Signed)
Per nursing notes pt has been informed.

## 2010-08-30 NOTE — Progress Notes (Signed)
Quick Note:  Pt informed, she is having the x-ray tomorrow morning ______

## 2010-08-30 NOTE — Telephone Encounter (Signed)
Pt is asking for lab results.

## 2010-08-30 NOTE — Telephone Encounter (Signed)
Pt called for second time today. Had labs on Friday and wants to know the results. Please call.

## 2010-08-31 ENCOUNTER — Telehealth: Payer: Self-pay | Admitting: Family Medicine

## 2010-08-31 ENCOUNTER — Ambulatory Visit (HOSPITAL_COMMUNITY)
Admission: RE | Admit: 2010-08-31 | Discharge: 2010-08-31 | Disposition: A | Discharge status: Home / Self Care | Payer: Medicare HMO | Source: Ambulatory Visit | Attending: Family Medicine | Admitting: Family Medicine

## 2010-08-31 ENCOUNTER — Other Ambulatory Visit: Payer: Self-pay | Admitting: Family Medicine

## 2010-08-31 DIAGNOSIS — R1013 Epigastric pain: Secondary | ICD-10-CM

## 2010-08-31 DIAGNOSIS — K259 Gastric ulcer, unspecified as acute or chronic, without hemorrhage or perforation: Secondary | ICD-10-CM | POA: Insufficient documentation

## 2010-08-31 NOTE — Telephone Encounter (Signed)
Refer GI.  1 or 2 small gastric ulcers noted.  She should remain on BID Prilosec.  Notify pt we are making referral.

## 2010-08-31 NOTE — Telephone Encounter (Signed)
Spoke to pt, She did have the UGI, but is still having pain and feels we ordered the wrong xray.  She wants an endoscopy.  Advised we are going to refill her to GI.  Pt agrees.

## 2010-08-31 NOTE — Telephone Encounter (Signed)
Pt called and is at [facility] for Barium testing. Pt said that the Barium is making pt sick on stomach and the xray tech told pt that this test would not show if pt had stomach ulcer. Pt req call back asap today.

## 2010-09-03 ENCOUNTER — Encounter: Payer: Self-pay | Admitting: Internal Medicine

## 2010-09-03 ENCOUNTER — Telehealth: Payer: Self-pay | Admitting: Internal Medicine

## 2010-09-03 NOTE — Progress Notes (Signed)
Quick Note:  Pt informed, GI appt this Friday ______

## 2010-09-03 NOTE — Telephone Encounter (Signed)
Pt states that she had an upper gi done at [facility] Friday that showed 2 ulcers in her stomach. Pt requesting an appt with Dr. Marina Goodell. Pt scheduled to see Dr. Marina Goodell Friday 09/07/10@3 :30pm. Pt aware of appt date and time.

## 2010-09-07 ENCOUNTER — Encounter: Payer: Self-pay | Admitting: Internal Medicine

## 2010-09-07 ENCOUNTER — Ambulatory Visit (INDEPENDENT_AMBULATORY_CARE_PROVIDER_SITE_OTHER): Payer: Medicare HMO | Admitting: Internal Medicine

## 2010-09-07 DIAGNOSIS — R1084 Generalized abdominal pain: Secondary | ICD-10-CM

## 2010-09-07 DIAGNOSIS — R933 Abnormal findings on diagnostic imaging of other parts of digestive tract: Secondary | ICD-10-CM

## 2010-09-07 DIAGNOSIS — Z86010 Personal history of colonic polyps: Secondary | ICD-10-CM

## 2010-09-07 DIAGNOSIS — K5900 Constipation, unspecified: Secondary | ICD-10-CM

## 2010-09-07 DIAGNOSIS — R1013 Epigastric pain: Secondary | ICD-10-CM

## 2010-09-07 DIAGNOSIS — K625 Hemorrhage of anus and rectum: Secondary | ICD-10-CM

## 2010-09-07 MED ORDER — PEG-KCL-NACL-NASULF-NA ASC-C 100 G PO SOLR: 1.0000 | Freq: Once | ORAL | Status: DC

## 2010-09-07 NOTE — Progress Notes (Signed)
HISTORY OF PRESENT ILLNESS:  Alison Garza is a 65 y.o. female with history of reflux disease, gastric ulcer, and adenomatous colon polyps on the background of chronic functional abdominal complaints. She is status post cholecystectomy. She presents now with acute on chronic abdominal pain. Specifically, epigastric pain. She was evaluated in 2008 for more chronic abdominal pain. Colonoscopy and upper endoscopy were performed December 2008. Colonoscopy revealed a diminutive colon polyp. Followup in 5 years recommended. Upper endoscopy revealed a distal esophageal stricture but no evidence of ulcer disease. She was to continue on PPI therapy. She is a suboptimal historian. It appears that she has been on various PPIs. Not clear if there has been disruption and therapy. She's also been on multiple NSAIDs including aspirin, BC powders, Aleve, Mobic, and Naprosyn. She takes these for right hip and leg pain. Over the past couple of weeks she's had more epigastric discomfort. She was sent for an upper GI series 08/31/2010. She was found to have one or 2 small gastric ulcers along the greater curvature of the stomach. Otherwise negative exam. Laboratories from last week show normal LFTs except for SGPT of 47. Normal amylase and normal CBC with hemoglobin of 14.5. She also complains of worsening constipation. Minor rectal bleeding at times. Some increased intestinal gas. No nausea, vomiting, melena, or weight loss.  REVIEW OF SYSTEMS:  All non-GI ROS negative except for arthritis, back pain, hip pain, cough, headaches, muscle cramps, ankle swelling, urinary incontinence.  Past Medical History  Diagnosis Date  . COLONIC POLYPS 05/29/2009  . HYPERLIPIDEMIA 02/29/2008  . GERD 05/29/2009  . OVERACTIVE BLADDER 02/29/2008  . MENOPAUSE, SURGICAL 02/29/2008  . Lumbago 03/12/2010  . Arthritis   . IBS (irritable bowel syndrome)     Past Surgical History  Procedure Date  . Abdominal hysterectomy 1976  .  Cholecystectomy 1984  . Kidney surgery 1980    to rotate kidney  . Colon surgery 2006    polyp removed  . Appendectomy 1973  . Ovarian cyst removal 2009  . Cesarean section     x 2    Social History Alison Garza  reports that she quit smoking about 42 years ago. Her smoking use included Cigarettes. She does not have any smokeless tobacco history on file. She reports that she does not drink alcohol or use illicit drugs.  family history includes Cerebral aneurysm (age of onset:60) in her brother; Deep vein thrombosis in her daughter; Hyperlipidemia in her sister; Pancreatic cancer in her mother; and Stroke in an unspecified family member.  Allergies  Allergen Reactions  . Ciprofloxacin     Upset Gi  . Codeine   . Penicillins   . Sulfonamide Derivatives        PHYSICAL EXAMINATION: Vital signs: BP 106/68  Pulse 80  Ht 4' 11.5" (1.511 m)  Wt 168 lb (76.204 kg)  BMI 33.36 kg/m2  Constitutional: generally well-appearing, no acute distress Psychiatric: alert and oriented x3, cooperative Eyes: extraocular movements intact, anicteric, conjunctiva pink Mouth: oral pharynx moist, no lesions Neck: supple no lymphadenopathy Cardiovascular: heart regular rate and rhythm, no murmur Lungs: clear to auscultation bilaterally Abdomen: soft, mild epigastric tenderness to palpation, nondistended, no obvious ascites, no peritoneal signs, normal bowel sounds, no organomegaly Rectal: Deferred until colonoscopy Extremities: no lower extremity edema bilaterally Skin: no lesions on visible extremities Neuro: No focal deficits.   ASSESSMENT:  #1. Acute epigastric pain secondary to recurrent ulcer disease #2. Upper GI series revealing ulcer disease #3. Chronic abdominal pain #  4. History of adenomatous colon polyps. Last exam 2008 #5. Minor rectal bleeding and constipation   PLAN:  #1. Increase PPI to twice a day #2. Avoid NSAIDs. She should talk to her prescribing physician regarding  alternatives for pain #3. Schedule upper endoscopy to evaluate ulcers and rule out malignancy, though unlikely.The nature of the procedure, as well as the risks, benefits, and alternatives were carefully and thoroughly reviewed with the patient. Ample time for discussion and questions allowed. The patient understood, was satisfied, and agreed to proceed.  #4. Schedule colonoscopy for polyp surveillance as well as to evaluate abdominal pain, bleeding, change in bowel habits.The nature of the procedure, as well as the risks, benefits, and alternatives were carefully and thoroughly reviewed with the patient. Ample time for discussion and questions allowed. The patient understood, was satisfied, and agreed to proceed. Movi prep prescribed. The patient instructed on its use

## 2010-09-07 NOTE — Patient Instructions (Signed)
Colon/Endo [facility] 10/04/10 1:30 pm arrive at 12:30 pm on 4th floor Moviprep prescription sent to your pharmacy. Colon/endo brochures given to you for review.

## 2010-09-10 ENCOUNTER — Encounter: Payer: Self-pay | Admitting: Internal Medicine

## 2010-09-24 ENCOUNTER — Other Ambulatory Visit: Payer: Self-pay | Admitting: Family Medicine

## 2010-09-24 DIAGNOSIS — Z1231 Encounter for screening mammogram for malignant neoplasm of breast: Secondary | ICD-10-CM

## 2010-10-04 ENCOUNTER — Encounter: Payer: Self-pay | Admitting: Internal Medicine

## 2010-10-04 ENCOUNTER — Ambulatory Visit (AMBULATORY_SURGERY_CENTER): Payer: Medicare HMO | Admitting: Internal Medicine

## 2010-10-04 VITALS — BP 123/66 | HR 73 | Temp 96.5°F | Resp 18 | Ht 59.0 in | Wt 168.0 lb

## 2010-10-04 DIAGNOSIS — D126 Benign neoplasm of colon, unspecified: Secondary | ICD-10-CM

## 2010-10-04 DIAGNOSIS — R1013 Epigastric pain: Secondary | ICD-10-CM

## 2010-10-04 DIAGNOSIS — R933 Abnormal findings on diagnostic imaging of other parts of digestive tract: Secondary | ICD-10-CM

## 2010-10-04 DIAGNOSIS — K625 Hemorrhage of anus and rectum: Secondary | ICD-10-CM

## 2010-10-04 DIAGNOSIS — Z86010 Personal history of colonic polyps: Secondary | ICD-10-CM

## 2010-10-04 DIAGNOSIS — K5900 Constipation, unspecified: Secondary | ICD-10-CM

## 2010-10-04 DIAGNOSIS — Z1211 Encounter for screening for malignant neoplasm of colon: Secondary | ICD-10-CM

## 2010-10-04 DIAGNOSIS — R1084 Generalized abdominal pain: Secondary | ICD-10-CM

## 2010-10-04 MED ORDER — SODIUM CHLORIDE 0.9 % IV SOLN: 500.0000 mL | INTRAVENOUS | Status: DC

## 2010-10-04 NOTE — Patient Instructions (Signed)
Discharge instructions given with verbal understanding. Handout on polyps given. Resume previous medications. Omeprazole 20mg  once daily. Avoid NSAIDS.

## 2010-10-05 ENCOUNTER — Telehealth: Payer: Self-pay | Admitting: *Deleted

## 2010-10-05 NOTE — Telephone Encounter (Signed)
No ID on answering machine, no message left 

## 2010-10-10 ENCOUNTER — Telehealth: Payer: Self-pay | Admitting: Internal Medicine

## 2010-10-10 NOTE — Telephone Encounter (Signed)
Left a message for patient to call me. 

## 2010-10-10 NOTE — Telephone Encounter (Signed)
Spoke with patient and gave her Dr. Brodie's recommendation.  

## 2010-10-10 NOTE — Telephone Encounter (Signed)
Patient calling to report she had her first bowel movement this AM since her ECOL on 10/04/10. At the end of the bowel movement she had a piece of white tissue  with bright red blood on it. The tissue was about the size of a plum. States she has gas and her abdomen is a little sore but not painful. Patient had ECOL on 10/04/10- EGD normal, Colon- 2 polyps removed in transverse colon(snared without cautery). Hx abdominal pain, gastric ulcers on UGI. Patient of Dr. Lamar Sprinkles. Please, advise.

## 2010-10-10 NOTE — Telephone Encounter (Signed)
If bleeding recurs more than once, she needs to call us back again. It has beed 6 days post colonoscopy, bleeding may occur up to 2 weeks following polyp removal.She needs to let us know if it is a large amount, not just a small amount.

## 2010-10-17 ENCOUNTER — Ambulatory Visit: Payer: Medicare HMO | Admitting: Internal Medicine

## 2010-10-23 ENCOUNTER — Telehealth: Payer: Self-pay | Admitting: Family Medicine

## 2010-10-23 NOTE — Telephone Encounter (Signed)
Please advise, pt last OV was 11/28/10.  Do you want to see her first?

## 2010-10-23 NOTE — Telephone Encounter (Signed)
Sister in-law is in 3rd stage of cancer and it is causing a lot of stress and anxiety. Pt would like to be prescribed something to help her get through this. Please contact pt.

## 2010-10-23 NOTE — Telephone Encounter (Signed)
Yes.  Office evaluation.

## 2010-10-24 NOTE — Telephone Encounter (Signed)
Correction, pt has CPX scheduled for 11/28/10 Pt will need OV if she is requesting new medication, 15 min Pt informed on personally identified VM

## 2010-10-30 ENCOUNTER — Ambulatory Visit (INDEPENDENT_AMBULATORY_CARE_PROVIDER_SITE_OTHER): Payer: Medicare HMO | Admitting: Family Medicine

## 2010-10-30 ENCOUNTER — Encounter: Payer: Self-pay | Admitting: Family Medicine

## 2010-10-30 VITALS — BP 130/82 | Temp 98.2°F | Wt 170.0 lb

## 2010-10-30 DIAGNOSIS — F411 Generalized anxiety disorder: Secondary | ICD-10-CM

## 2010-10-30 DIAGNOSIS — F419 Anxiety disorder, unspecified: Secondary | ICD-10-CM

## 2010-10-30 DIAGNOSIS — G4700 Insomnia, unspecified: Secondary | ICD-10-CM

## 2010-10-30 MED ORDER — LORAZEPAM 0.5 MG PO TABS: 0.5000 mg | ORAL_TABLET | Freq: Three times a day (TID) | ORAL | Status: AC | PRN

## 2010-10-30 NOTE — Progress Notes (Signed)
  Subjective:    Patient ID: Alison Garza, female    DOB: [birth year], 65 y.o.   MRN: [id number]  HPI Patient here with stress and anxiety issues. Her sister-in-law recently diagnosed with stage III lung cancer. She's having great difficulty sleeping. No alcohol or any excessive caffeine use. No daytime naps. Increased stress during the day as well. She is requesting something specifically for anxiety. Denies depressive symptoms. Patient had recent colonoscopy with adenomatous polyps. No complications from procedure. She has followup for physical in September   Review of Systems  Constitutional: Negative for fever, chills, appetite change and unexpected weight change.  Respiratory: Negative for cough and shortness of breath.   Cardiovascular: Negative for chest pain.  Neurological: Negative for dizziness and headaches.  Psychiatric/Behavioral: Negative for dysphoric mood. The patient is nervous/anxious.        Objective:   Physical Exam  Constitutional: She is oriented to person, place, and time. She appears well-developed and well-nourished. No distress.  Neck: Neck supple.  Cardiovascular: Normal rate and regular rhythm.   Pulmonary/Chest: Effort normal and breath sounds normal. No respiratory distress. She has no wheezes. She has no rales.  Musculoskeletal: She exhibits no edema.  Lymphadenopathy:    She has no cervical adenopathy.  Neurological: She is alert and oriented to person, place, and time.  Psychiatric: She has a normal mood and affect. Her behavior is normal. Judgment and thought content normal.          Assessment & Plan:  Situational anxiety. Discussed possible counseling. Discussed nonpharmacologic ways to help handle stress and anxiety. Short term only use of lorazepam 0.5 mg 1 each bedtime for severe insomnia. Sleep hygiene discussed.   reassess at physical.

## 2010-11-01 ENCOUNTER — Ambulatory Visit
Admission: RE | Admit: 2010-11-01 | Discharge: 2010-11-01 | Disposition: A | Discharge status: Home / Self Care | Payer: Medicare HMO | Source: Ambulatory Visit | Attending: Family Medicine | Admitting: Family Medicine

## 2010-11-01 DIAGNOSIS — Z1231 Encounter for screening mammogram for malignant neoplasm of breast: Secondary | ICD-10-CM

## 2010-11-28 ENCOUNTER — Encounter: Payer: Self-pay | Admitting: Family Medicine

## 2010-11-28 ENCOUNTER — Ambulatory Visit (INDEPENDENT_AMBULATORY_CARE_PROVIDER_SITE_OTHER): Payer: Medicare HMO | Admitting: Family Medicine

## 2010-11-28 VITALS — BP 110/72 | HR 60 | Temp 98.0°F | Resp 12 | Ht <= 58 in | Wt 168.0 lb

## 2010-11-28 DIAGNOSIS — K219 Gastro-esophageal reflux disease without esophagitis: Secondary | ICD-10-CM

## 2010-11-28 DIAGNOSIS — Z23 Encounter for immunization: Secondary | ICD-10-CM

## 2010-11-28 DIAGNOSIS — E785 Hyperlipidemia, unspecified: Secondary | ICD-10-CM

## 2010-11-28 DIAGNOSIS — M545 Low back pain, unspecified: Secondary | ICD-10-CM

## 2010-11-28 DIAGNOSIS — Z0000 Encounter for general adult medical examination without abnormal findings: Secondary | ICD-10-CM

## 2010-11-28 LAB — CBC WITH DIFFERENTIAL/PLATELET
Basophils Relative: 0.7 % (ref 0.0–3.0)
Eosinophils Relative: 3.5 % (ref 0.0–5.0)
HCT: 45.4 % (ref 36.0–46.0)
Hemoglobin: 14.8 g/dL (ref 12.0–15.0)
Lymphs Abs: 3.7 10*3/uL (ref 0.7–4.0)
MCV: 87.1 fl (ref 78.0–100.0)
Monocytes Absolute: 0.4 10*3/uL (ref 0.1–1.0)
Monocytes Relative: 5.2 % (ref 3.0–12.0)
Neutro Abs: 3.7 10*3/uL (ref 1.4–7.7)
Platelets: 202 10*3/uL (ref 150.0–400.0)
WBC: 8.2 10*3/uL (ref 4.5–10.5)

## 2010-11-28 LAB — BASIC METABOLIC PANEL
BUN: 14 mg/dL (ref 6–23)
Chloride: 105 mEq/L (ref 96–112)
GFR: 96.65 mL/min (ref >60.00)
Potassium: 4 mEq/L (ref 3.5–5.1)
Sodium: 141 mEq/L (ref 135–145)

## 2010-11-28 LAB — LIPID PANEL
Cholesterol: 190 mg/dL (ref 0–200)
HDL: 56.9 mg/dL (ref >39.00)
LDL Cholesterol: 125 mg/dL — ABNORMAL HIGH (ref 0–99)
VLDL: 8.6 mg/dL (ref 0.0–40.0)

## 2010-11-28 LAB — HEPATIC FUNCTION PANEL
Bilirubin, Direct: 0.1 mg/dL (ref 0.0–0.3)
Total Bilirubin: 0.8 mg/dL (ref 0.3–1.2)
Total Protein: 7.2 g/dL (ref 6.0–8.3)

## 2010-11-28 LAB — CBC
Platelets: 183
RDW: 13.3
WBC: 15.3 — ABNORMAL HIGH

## 2010-11-28 MED ORDER — SIMVASTATIN 20 MG PO TABS: 20.0000 mg | ORAL_TABLET | Freq: Every day | ORAL | Status: DC

## 2010-11-28 MED ORDER — PNEUMOCOCCAL VAC POLYVALENT 25 MCG/0.5ML IJ INJ: 0.5000 mL | INJECTION | Freq: Once | INTRAMUSCULAR | Status: DC

## 2010-11-28 NOTE — Progress Notes (Signed)
Quick Note:  Pt informed ______ 

## 2010-11-28 NOTE — Patient Instructions (Signed)
Consider shingles vaccine later this year.

## 2010-11-28 NOTE — Progress Notes (Signed)
Subjective:    Patient ID: Alison Garza, female    DOB: 1945/06/11, 65 y.o.   MRN: [id number]  HPI Patient here for initial Medicare wellness visit and followup medical problems.  She has ongoing back problems. She has seen neurosurgeon. Had epidural without much improvement. Overall symptoms stable. She has history of previous hysterectomy and ovariectomy. Continues to see gynecologist. Remains on combination estrogen and testosterone for hot flashes. She has continued to get Pap smears though she's had hysterectomy for benign disease. She presumably had fibroids. She has reflux controlled with omeprazole. Hyperlipidemia on simvastatin 20 mg daily. No myalgias. No history of peripheral vascular disease or CAD.  History of colon polyps with recent colonoscopy. Tetanus 2007. Mammogram this summer normal. Received flu vaccine today. No history of Pneumovax or shingles vaccine.  Past Medical History  Diagnosis Date  . COLONIC POLYPS 05/29/2009  . HYPERLIPIDEMIA 02/29/2008  . GERD 05/29/2009  . OVERACTIVE BLADDER 02/29/2008  . MENOPAUSE, SURGICAL 02/29/2008  . Lumbago 03/12/2010  . Arthritis   . IBS (irritable bowel syndrome)    Past Surgical History  Procedure Date  . Cholecystectomy 1984  . Kidney surgery 1980    to rotate kidney  . Colon surgery 2006    polyp removed  . Appendectomy 1973  . Ovarian cyst removal 2009  . Cesarean section     x 2  . Abdominal hysterectomy 1976    fiboids    reports that she quit smoking about 42 years ago. Her smoking use included Cigarettes. She does not have any smokeless tobacco history on file. She reports that she does not drink alcohol or use illicit drugs. family history includes Cerebral aneurysm (age of onset:60) in her brother; Deep vein thrombosis in her daughter; Hyperlipidemia in her sister; Pancreatic cancer in her mother; and Stroke in an unspecified family member. Allergies  Allergen Reactions  . Ciprofloxacin     Upset Gi  .  Codeine   . Penicillins   . Sulfonamide Derivatives    1.  Risk factors based on Past Medical , Social, and Family history  reviewed and as above 2.  Limitations in physical activities no limitation of activities. No recent falls 3.  Depression/mood no depression or anxiety issues 4.  Hearing no deficits 5.  ADLs fully independent in all 6.  Cognitive function (orientation to time and place, language, writing, speech,memory) no short or long-term memory deficits. Judgment intact. No language deficits 7.  Home Safety no issues identified 8.  Height, weight, and visual acuity. Height and weight stable. No visual difficulties. Regular eye exams and just saw ophthalmologist couple weeks ago. 9.  Counseling counseled regarding need to lose weight. Needs Pneumovax and consider shingles vaccine if covered by insurance. 10. Recommendation of preventive services. Flu vaccine. Pneumovax. Check of shingles vaccine. Mammogram and colonoscopy today. No indication for Pap smear 11. Labs based on risk factors lipid and hepatic panel 12. Care Plan as above. 13. Advanced directives-discussed.    Review of Systems  Constitutional: Negative for fever, activity change, appetite change, fatigue and unexpected weight change.  HENT: Negative for hearing loss, ear pain, sore throat and trouble swallowing.   Eyes: Negative for visual disturbance.  Respiratory: Negative for cough and shortness of breath.   Cardiovascular: Negative for chest pain and palpitations.  Gastrointestinal: Negative for abdominal pain, diarrhea, constipation and blood in stool.  Genitourinary: Negative for dysuria and hematuria.  Musculoskeletal: Positive for back pain. Negative for myalgias and arthralgias.  Skin: Negative  for rash.  Neurological: Negative for dizziness, seizures, syncope, weakness and headaches.  Hematological: Negative for adenopathy.  Psychiatric/Behavioral: Negative for confusion and dysphoric mood.         Objective:   Physical Exam  Constitutional: She is oriented to person, place, and time. She appears well-developed and well-nourished.  HENT:  Head: Normocephalic and atraumatic.  Eyes: EOM are normal. Pupils are equal, round, and reactive to light.  Neck: Normal range of motion. Neck supple. No thyromegaly present.  Cardiovascular: Normal rate, regular rhythm and normal heart sounds.   No murmur heard. Pulmonary/Chest: Breath sounds normal. No respiratory distress. She has no wheezes. She has no rales.  Abdominal: Soft. Bowel sounds are normal. She exhibits no distension and no mass. There is no tenderness. There is no rebound and no guarding.  Genitourinary:       Breast and pelvic per GYN  Musculoskeletal: Normal range of motion. She exhibits no edema.  Lymphadenopathy:    She has no cervical adenopathy.  Neurological: She is alert and oriented to person, place, and time. She displays normal reflexes. No cranial nerve deficit.  Skin: No rash noted.  Psychiatric: She has a normal mood and affect. Her behavior is normal. Judgment and thought content normal.          Assessment & Plan:  #1 health maintenance. Tetanus up-to-date. Flu vaccine given. Pneumovax given. Check coverage shingles vaccine. Colonoscopy and mammogram up to date. Baseline EKG. #2 hyperlipidemia. Refill medication for one year. Lipid and hepatic panel.  #3 GERD well controlled. Continue omeprazole 20 mg daily

## 2011-01-01 ENCOUNTER — Encounter: Payer: Self-pay | Admitting: Family Medicine

## 2011-01-01 ENCOUNTER — Ambulatory Visit (INDEPENDENT_AMBULATORY_CARE_PROVIDER_SITE_OTHER): Payer: Medicare HMO | Admitting: Family Medicine

## 2011-01-01 VITALS — BP 130/92 | Temp 98.0°F | Wt 172.0 lb

## 2011-01-01 DIAGNOSIS — M545 Low back pain: Secondary | ICD-10-CM

## 2011-01-01 NOTE — Progress Notes (Signed)
  Subjective:    Patient ID: Alison Garza, female    DOB: [birth year], 65 y.o.   MRN: [id number]  HPI  Patient seen to discuss low back pain issues.  Onset several months ago. 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 Patient was referred to neurosurgeon and had one epidural which seemed to help. She's had some recurrence of pain recently. No radiculopathy pain. Taking meloxicam 7.5 mg daily which seems to help as well. Recent change of insurance. Requesting referral to another neurosurgeon at this time. She denies any new symptoms such as incontinence, numbness, or any progressive weakness.  Past medical history significant for hyperlipidemia, GERD, urge incontinence.  Past Medical History  Diagnosis Date  . COLONIC POLYPS 05/29/2009  . HYPERLIPIDEMIA 02/29/2008  . GERD 05/29/2009  . OVERACTIVE BLADDER 02/29/2008  . MENOPAUSE, SURGICAL 02/29/2008  . Lumbago 03/12/2010  . Arthritis   . IBS (irritable bowel syndrome)    Past Surgical History  Procedure Date  . Cholecystectomy 1984  . Kidney surgery 1980    to rotate kidney  . Colon surgery 2006    polyp removed  . Appendectomy 1973  . Ovarian cyst removal 2009  . Cesarean section     x 2  . Abdominal hysterectomy 1976    fiboids    reports that she quit smoking about 42 years ago. Her smoking use included Cigarettes. She does not have any smokeless tobacco history on file. She reports that she does not drink alcohol or use illicit drugs. family history includes Cerebral aneurysm (age of onset:60) in her brother; Deep vein thrombosis in her daughter; Hyperlipidemia in her sister; Pancreatic cancer in her mother; and Stroke in an unspecified family member. Allergies  Allergen Reactions  . Ciprofloxacin     Upset Gi  . Codeine   . Penicillins   . Sulfonamide Derivatives      Review of Systems  Constitutional: Negative for fever,  chills, appetite change and fatigue.  Respiratory: Negative for cough and shortness of breath.   Cardiovascular: Negative for chest pain, palpitations and leg swelling.  Gastrointestinal: Negative for abdominal pain.  Genitourinary: Negative for dysuria and frequency.  Musculoskeletal: Positive for back pain.       Objective:   Physical Exam  Constitutional: She appears well-developed and well-nourished. No distress.  Cardiovascular: Normal rate and regular rhythm.   Pulmonary/Chest: Effort normal and breath sounds normal. No respiratory distress. She has no wheezes. She has no rales.  Musculoskeletal: She exhibits no edema.       Straight leg raise is negative  Neurological:       Full-strength lower extremities. Symmetric reflexes.          Assessment & Plan:  Low back pain. Patient requesting referral to another neurosurgical group because of change of insurance. We'll proceed. Continue meloxicam.

## 2011-02-19 ENCOUNTER — Encounter (HOSPITAL_BASED_OUTPATIENT_CLINIC_OR_DEPARTMENT_OTHER): Payer: Self-pay | Admitting: *Deleted

## 2011-02-19 ENCOUNTER — Emergency Department (HOSPITAL_BASED_OUTPATIENT_CLINIC_OR_DEPARTMENT_OTHER)
Admission: EM | Admit: 2011-02-19 | Discharge: 2011-02-19 | Disposition: A | Discharge status: Home / Self Care | Payer: Medicare HMO | Attending: Emergency Medicine | Admitting: Emergency Medicine

## 2011-02-19 DIAGNOSIS — E785 Hyperlipidemia, unspecified: Secondary | ICD-10-CM | POA: Insufficient documentation

## 2011-02-19 DIAGNOSIS — I10 Essential (primary) hypertension: Secondary | ICD-10-CM | POA: Insufficient documentation

## 2011-02-19 DIAGNOSIS — Z79899 Other long term (current) drug therapy: Secondary | ICD-10-CM | POA: Insufficient documentation

## 2011-02-19 DIAGNOSIS — K589 Irritable bowel syndrome without diarrhea: Secondary | ICD-10-CM | POA: Insufficient documentation

## 2011-02-19 LAB — URINALYSIS, ROUTINE W REFLEX MICROSCOPIC
Glucose, UA: NEGATIVE mg/dL
Leukocytes, UA: NEGATIVE
Nitrite: NEGATIVE
pH: 7.5 (ref 5.0–8.0)

## 2011-02-19 LAB — BASIC METABOLIC PANEL
CO2: 28 mEq/L (ref 19–32)
Chloride: 103 mEq/L (ref 96–112)
GFR calc Af Amer: 88 mL/min — ABNORMAL LOW (ref >90)
Potassium: 3.9 mEq/L (ref 3.5–5.1)
Sodium: 139 mEq/L (ref 135–145)

## 2011-02-19 MED ORDER — HYDROCHLOROTHIAZIDE 25 MG PO TABS: 25.0000 mg | ORAL_TABLET | Freq: Every day | ORAL | Status: DC

## 2011-02-19 MED ORDER — TRAMADOL-ACETAMINOPHEN 37.5-325 MG PO TABS: 1.0000 | ORAL_TABLET | Freq: Four times a day (QID) | ORAL | Status: DC | PRN

## 2011-02-19 NOTE — ED Notes (Signed)
Pt was seen at pain clinic today, pt has had 3 elevated BP reading

## 2011-02-19 NOTE — ED Provider Notes (Signed)
History     CSN: [id number] Arrival date & time: 02/19/2011  8:05 PM   First MD Initiated Contact with Patient 02/19/11 2010      Chief Complaint  Patient presents with  . Hypertension    (Consider location/radiation/quality/duration/timing/severity/associated sxs/prior treatment) Patient is a 65 y.o. female presenting with hypertension. The history is provided by the patient. No language interpreter was used.  Hypertension This is a new problem. The current episode started today. The problem occurs constantly. The problem has been gradually worsening. Pertinent negatives include no chest pain, fever or headaches. The symptoms are aggravated by nothing. She has tried nothing for the symptoms.  Pt was seen at MD today and blood pressure was elevated. 140/100.  Pt reports she checked at home and it was 200/120.  Pt reports she checked at [facility] on machine and blood pressure was 150/90.  Pt reports she saw her Md in Sept and it was normal.  Past Medical History  Diagnosis Date  . COLONIC POLYPS 05/29/2009  . HYPERLIPIDEMIA 02/29/2008  . GERD 05/29/2009  . OVERACTIVE BLADDER 02/29/2008  . MENOPAUSE, SURGICAL 02/29/2008  . Lumbago 03/12/2010  . Arthritis   . IBS (irritable bowel syndrome)     Past Surgical History  Procedure Date  . Cholecystectomy 1984  . Kidney surgery 1980    to rotate kidney  . Colon surgery 2006    polyp removed  . Appendectomy 1973  . Ovarian cyst removal 2009  . Cesarean section     x 2  . Abdominal hysterectomy 1976    fiboids    Family History  Problem Relation Age of Onset  . Pancreatic cancer Mother   . Hyperlipidemia Sister   . Cerebral aneurysm Brother 60  . Deep vein thrombosis Daughter   . Stroke      maternal family    History  Substance Use Topics  . Smoking status: Former Smoker    Types: Cigarettes    Quit date: 03/04/1968  . Smokeless tobacco: Not on file  . Alcohol Use: No    OB History    Grav Para Term Preterm Abortions  TAB SAB Ect Mult Living                  Review of Systems  Constitutional: Negative for fever.  Cardiovascular: Negative for chest pain.  Neurological: Negative for headaches.  All other systems reviewed and are negative.    Allergies  Ciprofloxacin; Codeine; Sulfonamide derivatives; and Penicillins  Home Medications   Current Outpatient Rx  Name Route Sig Dispense Refill  . ESTROGENS CONJUGATED 1.25 MG PO TABS Oral Take 1.25 mg by mouth daily.      . ADULT MULTIVITAMIN W/MINERALS CH Oral Take 1 tablet by mouth daily.      Marland Kitchen OMEPRAZOLE MAGNESIUM 20 MG PO TBEC Oral Take 1 tablet (20 mg total) by mouth daily. 90 tablet 3  . OXYBUTYNIN CHLORIDE ER 5 MG PO TB24 Oral Take 5 mg by mouth daily as needed. For bladder spasms    . RESTASIS 0.05 % OP EMUL Both Eyes Place 1 drop into both eyes every 12 (twelve) hours.     Marland Kitchen SIMVASTATIN 20 MG PO TABS Oral Take 1 tablet (20 mg total) by mouth at bedtime. 90 tablet 3  . TRAMADOL-ACETAMINOPHEN 37.5-325 MG PO TABS Oral Take 1 tablet by mouth every 6 (six) hours as needed. For pain       BP 150/86  Pulse 86  Temp(Src) 97.7 F (36.5  C) (Oral)  Ht 4\' 11"  (1.499 m)  Wt 170 lb (77.111 kg)  BMI 34.34 kg/m2  SpO2 96%  Physical Exam  Vitals reviewed. Constitutional: She appears well-developed and well-nourished.  HENT:  Head: Normocephalic.  Right Ear: External ear normal.  Eyes: Conjunctivae are normal. Pupils are equal, round, and reactive to light.  Neck: Normal range of motion. Neck supple.  Cardiovascular: Normal rate and normal heart sounds.   Pulmonary/Chest: Effort normal.  Abdominal: Soft.  Musculoskeletal: Normal range of motion.  Neurological: She is alert.  Skin: Skin is warm.  Psychiatric: She has a normal mood and affect.    ED Course  Procedures (including critical care time)  Labs Reviewed - No data to display No results found.   No diagnosis found.    MDM   Results for orders placed during the hospital  encounter of 02/19/11  BASIC METABOLIC PANEL      Component Value Range   Sodium 139  135 - 145 (mEq/L)   Potassium 3.9  3.5 - 5.1 (mEq/L)   Chloride 103  96 - 112 (mEq/L)   CO2 28  19 - 32 (mEq/L)   Glucose, Bld 87  70 - 99 (mg/dL)   BUN 9  6 - 23 (mg/dL)   Creatinine, Ser 1.61  0.50 - 1.10 (mg/dL)   Calcium 9.3  8.4 - 09.6 (mg/dL)   GFR calc non Af Amer 76 (*) >90 (mL/min)   GFR calc Af Amer 88 (*) >90 (mL/min)  URINALYSIS, ROUTINE W REFLEX MICROSCOPIC      Component Value Range   Color, Urine YELLOW  YELLOW    APPearance CLEAR  CLEAR    Specific Gravity, Urine 1.013  1.005 - 1.030    pH 7.5  5.0 - 8.0    Glucose, UA NEGATIVE  NEGATIVE (mg/dL)   Hgb urine dipstick NEGATIVE  NEGATIVE    Bilirubin Urine NEGATIVE  NEGATIVE    Ketones, ur NEGATIVE  NEGATIVE (mg/dL)   Protein, ur NEGATIVE  NEGATIVE (mg/dL)   Urobilinogen, UA 1.0  0.0 - 1.0 (mg/dL)   Nitrite NEGATIVE  NEGATIVE    Leukocytes, UA NEGATIVE  NEGATIVE    No results found. Blood pressure slightly elevated.  i will start hctz and have pt see her MD for recheck.        Langston Masker, [place] 02/19/11 2143

## 2011-02-20 ENCOUNTER — Other Ambulatory Visit: Payer: Self-pay

## 2011-02-20 ENCOUNTER — Telehealth: Payer: Self-pay

## 2011-02-20 NOTE — ED Provider Notes (Signed)
Medical screening examination/treatment/procedure(s) were performed by non-physician practitioner and as supervising physician I was immediately available for consultation/collaboration.   Corbitt Cloke, MD 02/20/11 0849 

## 2011-02-20 NOTE — Telephone Encounter (Signed)
Pt went to the pharmacy to get the medication that was prescribed by [facility] and pt was told that it contained sulfa and pt is allergic to sulfa. Spoke with pharmacist at [facility] in [facility] and he stated HCTZ was called in and he wanted to make sure Dr. Caryl Never approved of this medication.  Pls advise.

## 2011-02-20 NOTE — Telephone Encounter (Signed)
OK to start but stop if any itching or hives.

## 2011-02-21 ENCOUNTER — Ambulatory Visit (INDEPENDENT_AMBULATORY_CARE_PROVIDER_SITE_OTHER): Payer: Medicare HMO | Admitting: Family Medicine

## 2011-02-21 ENCOUNTER — Ambulatory Visit: Payer: Medicare HMO | Admitting: Family Medicine

## 2011-02-21 ENCOUNTER — Encounter: Payer: Self-pay | Admitting: Family Medicine

## 2011-02-21 VITALS — BP 128/80 | Temp 98.4°F | Wt 173.0 lb

## 2011-02-21 DIAGNOSIS — R030 Elevated blood-pressure reading, without diagnosis of hypertension: Secondary | ICD-10-CM

## 2011-02-21 NOTE — Patient Instructions (Addendum)

## 2011-02-21 NOTE — Progress Notes (Signed)
  Subjective:    Patient ID: Alison Garza, female    DOB: Nov 09, 1945, 65 y.o.   MRN: [id number]  HPI  Hypertension assessment. Patient has had some recent low back pain. Went to the emergency room in [facility] and blood pressure was elevated around 140/100. Subsequently she was seen at [facility] emergency room. Blood pressure there 150/90. She's never been treated for hypertension.  She does have some increased pain related to her back. Trying to lose weight and watching sodium closely. HCTZ prescribed but never filled because of her history of sulfa intolerance. No true allergy but intolerance with nausea and vomiting. Patient denies any headaches or dyspnea. No chest pain.   Review of Systems Aspirate history of present illness    Objective:   Physical Exam  Constitutional: She appears well-developed and well-nourished.  Neck: Neck supple. No thyromegaly present.  Cardiovascular: Normal rate and regular rhythm.   Pulmonary/Chest: Effort normal and breath sounds normal. No respiratory distress. She has no wheezes. She has no rales.  Musculoskeletal: She exhibits no edema.  Lymphadenopathy:    She has no cervical adenopathy.          Assessment & Plan:  Recent elevated blood pressure. Blood pressure today 128/80 which is confirmed in both arms. Observe for now. Work on weight loss and sodium reduction. Reassess 2 months.  Do not start HCTZ at this time.

## 2011-02-21 NOTE — Telephone Encounter (Signed)
Pt has appt this morning at 9:45.

## 2011-04-24 ENCOUNTER — Encounter: Payer: Self-pay | Admitting: Family Medicine

## 2011-04-24 ENCOUNTER — Ambulatory Visit (INDEPENDENT_AMBULATORY_CARE_PROVIDER_SITE_OTHER): Payer: Medicare HMO | Admitting: Family Medicine

## 2011-04-24 DIAGNOSIS — R350 Frequency of micturition: Secondary | ICD-10-CM

## 2011-04-24 DIAGNOSIS — R030 Elevated blood-pressure reading, without diagnosis of hypertension: Secondary | ICD-10-CM

## 2011-04-24 DIAGNOSIS — K5900 Constipation, unspecified: Secondary | ICD-10-CM

## 2011-04-24 DIAGNOSIS — R1031 Right lower quadrant pain: Secondary | ICD-10-CM

## 2011-04-24 LAB — CBC WITH DIFFERENTIAL/PLATELET
Basophils Absolute: 0 10*3/uL (ref 0.0–0.1)
Eosinophils Absolute: 0.1 10*3/uL (ref 0.0–0.7)
Hemoglobin: 15.9 g/dL — ABNORMAL HIGH (ref 12.0–15.0)
Lymphocytes Relative: 33.6 % (ref 12.0–46.0)
MCHC: 32.9 g/dL (ref 30.0–36.0)
Monocytes Relative: 5.9 % (ref 3.0–12.0)
Neutro Abs: 6.1 10*3/uL (ref 1.4–7.7)
Neutrophils Relative %: 59.2 % (ref 43.0–77.0)
RBC: 5.53 Mil/uL — ABNORMAL HIGH (ref 3.87–5.11)
RDW: 14.6 % (ref 11.5–14.6)

## 2011-04-24 LAB — POCT URINALYSIS DIPSTICK
Bilirubin, UA: NEGATIVE
Blood, UA: NEGATIVE
Glucose, UA: NEGATIVE
Leukocytes, UA: NEGATIVE
Nitrite, UA: NEGATIVE
Urobilinogen, UA: 0.2
pH, UA: 6.5

## 2011-04-24 MED ORDER — OMEPRAZOLE MAGNESIUM 20 MG PO TBEC: 20.0000 mg | DELAYED_RELEASE_TABLET | Freq: Two times a day (BID) | ORAL | Status: DC

## 2011-04-24 MED ORDER — LOSARTAN POTASSIUM 50 MG PO TABS: 50.0000 mg | ORAL_TABLET | Freq: Every day | ORAL | Status: DC

## 2011-04-24 NOTE — Patient Instructions (Signed)
Continue with weight loss efforts and continue with blood pressure monitoring.

## 2011-04-24 NOTE — Progress Notes (Signed)
  Subjective:    Patient ID: Alison Garza, female    DOB: May 03, 1945, 66 y.o.   MRN: [id number]  HPI  History assess elevated blood pressure. Refer to prior note. She has lost about 5 pounds. She has intermittent headaches. Some malaise. Checking blood pressures usually around 140 systolic and occasionally as high as 170-180. No chest pains. No dizziness. No dyspnea. History of mild peripheral edema previously.  Also complains of urine frequency past several days. No burning with urination. No fever or chills. Intermittent right lower quadrant abdominal pain. Had colonoscopy last year. No bloody stools. Occasional constipation. Takes intermittent laxatives  Past Medical History  Diagnosis Date  . COLONIC POLYPS 05/29/2009  . HYPERLIPIDEMIA 02/29/2008  . GERD 05/29/2009  . OVERACTIVE BLADDER 02/29/2008  . MENOPAUSE, SURGICAL 02/29/2008  . Lumbago 03/12/2010  . Arthritis   . IBS (irritable bowel syndrome)    Past Surgical History  Procedure Date  . Cholecystectomy 1984  . Kidney surgery 1980    to rotate kidney  . Colon surgery 2006    polyp removed  . Appendectomy 1973  . Ovarian cyst removal 2009  . Cesarean section     x 2  . Abdominal hysterectomy 1976    fiboids    reports that she quit smoking about 43 years ago. Her smoking use included Cigarettes. She does not have any smokeless tobacco history on file. She reports that she does not drink alcohol or use illicit drugs. family history includes Cerebral aneurysm (age of onset:60) in her brother; Deep vein thrombosis in her daughter; Hyperlipidemia in her sister; Pancreatic cancer in her mother; and Stroke in an unspecified family member. Allergies  Allergen Reactions  . Ciprofloxacin     Upset Gi  . Codeine Nausea Only  . Sulfonamide Derivatives Nausea And Vomiting  . Penicillins Rash      Review of Systems  Constitutional: Negative for fatigue.  Eyes: Negative for visual disturbance.  Respiratory: Negative for  cough, chest tightness, shortness of breath and wheezing.   Cardiovascular: Negative for chest pain, palpitations and leg swelling.  Genitourinary: Positive for frequency. Negative for dysuria, decreased urine volume and vaginal discharge.  Neurological: Negative for dizziness, seizures, syncope, weakness, light-headedness and headaches.       Objective:   Physical Exam  Constitutional: She appears well-developed and well-nourished.  Neck: Neck supple. No thyromegaly present.  Cardiovascular: Normal rate and regular rhythm.   Pulmonary/Chest: Effort normal and breath sounds normal. No respiratory distress. She has no wheezes. She has no rales.  Abdominal: Soft. Bowel sounds are normal. She exhibits no distension and no mass. There is no rebound and no guarding.       Minimally tender right lower quadrant to palpation  Lymphadenopathy:    She has no cervical adenopathy.          Assessment & Plan:  #1 hypertension. Currently untreated. Start losartan 50 mg daily and reassess blood pressure one month. Continue weight loss efforts. Continue low sodium diet  #2 urine frequency. Rule out UTI. Check urine dipstick #3 constipation.  Discussed measures to reduce and need to avoid regular laxatives

## 2011-04-25 NOTE — Progress Notes (Signed)
Quick Note:  Pt informed on home VM ______ 

## 2011-05-23 ENCOUNTER — Encounter: Payer: Self-pay | Admitting: Family Medicine

## 2011-05-23 ENCOUNTER — Ambulatory Visit (INDEPENDENT_AMBULATORY_CARE_PROVIDER_SITE_OTHER): Payer: Medicare HMO | Admitting: Family Medicine

## 2011-05-23 VITALS — BP 126/78 | HR 68 | Temp 97.8°F | Wt 170.0 lb

## 2011-05-23 DIAGNOSIS — I10 Essential (primary) hypertension: Secondary | ICD-10-CM | POA: Insufficient documentation

## 2011-05-23 MED ORDER — TRIAMCINOLONE ACETONIDE 0.1 % EX CREA: TOPICAL_CREAM | Freq: Two times a day (BID) | CUTANEOUS | Status: DC

## 2011-05-23 NOTE — Patient Instructions (Signed)
Start back Losartan IF BP is consistently > 140/90 Work on weight loss.

## 2011-05-23 NOTE — Progress Notes (Signed)
  Subjective:    Patient ID: Alison Garza, female    DOB: 05/04/1945, 66 y.o.   MRN: [id number]  HPI  Patient seen for the following issues  Followup hypertension. Started losartan 50 mg daily last visit. She was tolerating well and blood pressures were very well controlled by home readings but she stopped this about a week ago after noticing follicular type rash on her legs. Rash did not start until 3 weeks after she started medication. No hives. No angioedema. Blood pressures been stable off losartan over the past week. No headaches or dizziness.  Small scattered follicular type rash thighs and upper legs over the past week. Used calamine lotion without help. They tend to dry leaving a small pit in the center. No pustular rash. No vesicles. No clear exacerbating factors. No history of similar rash  Past Medical History  Diagnosis Date  . COLONIC POLYPS 05/29/2009  . HYPERLIPIDEMIA 02/29/2008  . GERD 05/29/2009  . OVERACTIVE BLADDER 02/29/2008  . MENOPAUSE, SURGICAL 02/29/2008  . Lumbago 03/12/2010  . Arthritis   . IBS (irritable bowel syndrome)    Past Surgical History  Procedure Date  . Cholecystectomy 1984  . Kidney surgery 1980    to rotate kidney  . Colon surgery 2006    polyp removed  . Appendectomy 1973  . Ovarian cyst removal 2009  . Cesarean section     x 2  . Abdominal hysterectomy 1976    fiboids    reports that she quit smoking about 43 years ago. Her smoking use included Cigarettes. She does not have any smokeless tobacco history on file. She reports that she does not drink alcohol or use illicit drugs. family history includes Cerebral aneurysm (age of onset:60) in her brother; Deep vein thrombosis in her daughter; Hyperlipidemia in her sister; Pancreatic cancer in her mother; and Stroke in an unspecified family member. Allergies  Allergen Reactions  . Ciprofloxacin     Upset Gi  . Codeine Nausea Only  . Sulfonamide Derivatives Nausea And Vomiting  .  Penicillins Rash     Review of Systems  Constitutional: Negative for fever, chills and fatigue.  Eyes: Negative for visual disturbance.  Respiratory: Negative for cough, chest tightness, shortness of breath and wheezing.   Cardiovascular: Negative for chest pain, palpitations and leg swelling.  Skin: Positive for rash.  Neurological: Negative for dizziness, seizures, syncope, weakness, light-headedness and headaches.  Hematological: Negative for adenopathy.       Objective:   Physical Exam  Constitutional: She appears well-developed and well-nourished.  Neck: Neck supple.  Cardiovascular: Normal rate and regular rhythm.   Pulmonary/Chest: Effort normal and breath sounds normal. No respiratory distress. She has no wheezes. She has no rales.  Musculoskeletal: She exhibits no edema.  Lymphadenopathy:    She has no cervical adenopathy.  Skin:       Patient has just a couple of very small approximately 1 mm minimally raised follicular rash left thigh and left leg. Slightly crusted center. No pustules. No vesicles. Nontender          Assessment & Plan:  #1 hypertension. Currently stable off medication. Continue close monitoring. Reinitiate losartan if consistently above 140/90. Work on weight loss #2 follicular type rash lower extremity. Question etiology. Triamcinolone 0.6 range twice daily as needed.

## 2011-09-23 ENCOUNTER — Ambulatory Visit: Payer: Medicare HMO | Admitting: Family Medicine

## 2011-09-26 ENCOUNTER — Encounter: Payer: Self-pay | Admitting: Family Medicine

## 2011-09-26 ENCOUNTER — Ambulatory Visit (INDEPENDENT_AMBULATORY_CARE_PROVIDER_SITE_OTHER): Payer: Medicare HMO | Admitting: Family Medicine

## 2011-09-26 VITALS — BP 144/88 | Temp 98.4°F | Wt 172.0 lb

## 2011-09-26 DIAGNOSIS — I10 Essential (primary) hypertension: Secondary | ICD-10-CM

## 2011-09-26 NOTE — Progress Notes (Signed)
  Subjective:    Patient ID: Alison Garza, female    DOB: [birth year], 66 y.o.   MRN: [id number]  HPI  Here for followup hypertension. She had discontinued her losartan recently. Blood pressures have been stable but at podiatrist's office earlier today had reading of 143/93. No headaches or dizziness. Couple pounds weight gain since last visit. She has some mild peripheral edema. Blood pressure previously well controlled with losartan. No chest pains. No dyspnea.   Review of Systems  Constitutional: Negative for fatigue.  Eyes: Negative for visual disturbance.  Respiratory: Negative for cough, chest tightness, shortness of breath and wheezing.   Cardiovascular: Negative for chest pain, palpitations and leg swelling.  Neurological: Negative for dizziness, seizures, syncope, weakness, light-headedness and headaches.       Objective:   Physical Exam  Constitutional: She appears well-developed and well-nourished. No distress.  Cardiovascular: Normal rate and regular rhythm.   Pulmonary/Chest: Effort normal and breath sounds normal. No respiratory distress. She has no wheezes. She has no rales.  Musculoskeletal: She exhibits no edema.          Assessment & Plan:  Hypertension. Mildly elevated. Start back losartan 50 mg daily. Lose weight. Followup at physical in September

## 2011-09-26 NOTE — Patient Instructions (Addendum)

## 2011-11-25 LAB — HM MAMMOGRAPHY: HM Mammogram: NEGATIVE

## 2011-11-25 LAB — HM PAP SMEAR: HM Pap smear: NEGATIVE

## 2011-11-27 ENCOUNTER — Other Ambulatory Visit: Payer: Self-pay | Admitting: *Deleted

## 2011-11-27 MED ORDER — SIMVASTATIN 20 MG PO TABS: 20.0000 mg | ORAL_TABLET | Freq: Every day | ORAL | Status: DC

## 2011-12-03 ENCOUNTER — Encounter: Payer: Medicare HMO | Admitting: Family Medicine

## 2011-12-03 ENCOUNTER — Encounter: Payer: Medicare HMO | Admitting: Internal Medicine

## 2011-12-25 ENCOUNTER — Encounter: Payer: Self-pay | Admitting: Family Medicine

## 2011-12-25 ENCOUNTER — Ambulatory Visit (INDEPENDENT_AMBULATORY_CARE_PROVIDER_SITE_OTHER): Payer: Medicare HMO | Admitting: Family Medicine

## 2011-12-25 VITALS — BP 124/88 | HR 72 | Temp 97.5°F | Resp 12 | Ht <= 58 in | Wt 175.0 lb

## 2011-12-25 DIAGNOSIS — E785 Hyperlipidemia, unspecified: Secondary | ICD-10-CM

## 2011-12-25 DIAGNOSIS — Z23 Encounter for immunization: Secondary | ICD-10-CM

## 2011-12-25 DIAGNOSIS — Z0000 Encounter for general adult medical examination without abnormal findings: Secondary | ICD-10-CM

## 2011-12-25 DIAGNOSIS — I10 Essential (primary) hypertension: Secondary | ICD-10-CM

## 2011-12-25 LAB — HEPATIC FUNCTION PANEL
ALT: 21 U/L (ref 0–35)
AST: 20 U/L (ref 0–37)
Albumin: 3.4 g/dL — ABNORMAL LOW (ref 3.5–5.2)
Total Protein: 7 g/dL (ref 6.0–8.3)

## 2011-12-25 LAB — BASIC METABOLIC PANEL
Calcium: 8.7 mg/dL (ref 8.4–10.5)
GFR: 94.91 mL/min (ref >60.00)
Potassium: 3.7 mEq/L (ref 3.5–5.1)
Sodium: 140 mEq/L (ref 135–145)

## 2011-12-25 LAB — POCT URINALYSIS DIPSTICK
Ketones, UA: NEGATIVE
Leukocytes, UA: NEGATIVE
Protein, UA: NEGATIVE
Urobilinogen, UA: 0.2
pH, UA: 7.5

## 2011-12-25 LAB — LIPID PANEL
Cholesterol: 169 mg/dL (ref 0–200)
HDL: 46.5 mg/dL (ref >39.00)
Triglycerides: 61 mg/dL (ref 0.0–149.0)
VLDL: 12.2 mg/dL (ref 0.0–40.0)

## 2011-12-25 MED ORDER — OXYBUTYNIN CHLORIDE ER 5 MG PO TB24: 5.0000 mg | ORAL_TABLET | Freq: Every day | ORAL | Status: DC | PRN

## 2011-12-25 NOTE — Progress Notes (Signed)
Subjective:    Patient ID: Alison Garza, female    DOB: October 18, 1945, 66 y.o.   MRN: [id number]  HPI  Patient here for complete physical. She sees gynecologist and is getting Pap smears every other year and she's apparently had previous hysterectomy for benign disease. Her colonoscopy is up to date. No history of shingles vaccine. Pneumovax up-to-date. Needs flu vaccine. Recent foot surgery and has been exercising less because of that. Having good recovery.  Chronic problems include obesity, hyperlipidemia, hypertension, GERD, urinary urgency. Requesting refills of oxybutynin. No constipation issues. Compliant with meds.   No side effects.  Nonsmoker. Mammogram up-to-date.  1.  Risk factors based on Past Medical , Social, and Family history reviewed as below. 2.  Limitations in physical activities None. Low risk for falls. 3.  Depression/mood No depression isssues 4.  Hearing no defecits 5.  ADLs fully independent in all 6.  Cognitive function (orientation to time and place, language, writing, speech,memory) no memory deficits.  No language or judgement issues. 7.  Home Safety no issues 8.  Height, weight, and visual acuity. All stable. 9.  Counseling weight loss and exercise discussed. 10. Recommendation of preventive services. Flu vaccine.  Check on shingle vaccine coverage. 11. Labs based on risk factors lipid, hepatic, bmp. 12. Care Plan as above.   Past Medical History  Diagnosis Date  . COLONIC POLYPS 05/29/2009  . HYPERLIPIDEMIA 02/29/2008  . GERD 05/29/2009  . OVERACTIVE BLADDER 02/29/2008  . MENOPAUSE, SURGICAL 02/29/2008  . Lumbago 03/12/2010  . Arthritis   . IBS (irritable bowel syndrome)    Past Surgical History  Procedure Date  . Cholecystectomy 1984  . Kidney surgery 1980    to rotate kidney  . Colon surgery 2006    polyp removed  . Appendectomy 1973  . Ovarian cyst removal 2009  . Cesarean section     x 2  . Abdominal hysterectomy 1976    fiboids    reports that she quit smoking about 43 years ago. Her smoking use included Cigarettes. She does not have any smokeless tobacco history on file. She reports that she does not drink alcohol or use illicit drugs. family history includes Cerebral aneurysm (age of onset:60) in her brother; Deep vein thrombosis in her daughter; Hyperlipidemia in her sister; Pancreatic cancer in her mother; and Stroke in an unspecified family member. Allergies  Allergen Reactions  . Ciprofloxacin     Upset Gi  . Codeine Nausea Only  . Sulfonamide Derivatives Nausea And Vomiting  . Penicillins Rash      Review of Systems  Constitutional: Negative for fever, activity change, appetite change and fatigue.  HENT: Negative for hearing loss, ear pain, sore throat and trouble swallowing.   Eyes: Negative for visual disturbance.  Respiratory: Negative for cough and shortness of breath.   Cardiovascular: Negative for chest pain and palpitations.  Gastrointestinal: Negative for abdominal pain, diarrhea, constipation and blood in stool.  Genitourinary: Negative for dysuria and hematuria.  Musculoskeletal: Negative for myalgias, back pain and arthralgias.  Skin: Negative for rash.  Neurological: Negative for dizziness, syncope and headaches.  Hematological: Negative for adenopathy.  Psychiatric/Behavioral: Negative for confusion and dysphoric mood.       Objective:   Physical Exam  Constitutional: She is oriented to person, place, and time. She appears well-developed and well-nourished.  HENT:  Right Ear: External ear normal.  Left Ear: External ear normal.  Mouth/Throat: Oropharynx is clear and moist.  Eyes: Pupils are equal, round, and reactive  to light.  Neck: Neck supple. No thyromegaly present.  Cardiovascular: Normal rate and regular rhythm.   Pulmonary/Chest: Effort normal and breath sounds normal. No respiratory distress. She has no wheezes. She has no rales.  Abdominal: Soft. Bowel sounds are normal. She  exhibits no distension and no mass. There is no tenderness. There is no rebound and no guarding.  Genitourinary:       Per GYN  Musculoskeletal: She exhibits no edema.  Lymphadenopathy:    She has no cervical adenopathy.  Neurological: She is alert and oriented to person, place, and time. No cranial nerve deficit.  Skin: No rash noted.  Psychiatric: She has a normal mood and affect. Her behavior is normal.          Assessment & Plan:  Complete physical. Check on insurance coverage for shingles vaccine. Flu vaccine given. Obtain lab work regarding her chronic problems which include hyperlipidemia and hypertension. Establish more consistent exercise and work on weight loss Hypertension.  Stable Hyperlipidemia.  Check lipids and hepatic Urine urgency.  Refilled oxybutynin

## 2011-12-25 NOTE — Patient Instructions (Signed)
Work on weight loss and establish more consistent exercise.

## 2011-12-26 NOTE — Progress Notes (Signed)
Quick Note:  Pt informed ______ 

## 2012-05-14 ENCOUNTER — Other Ambulatory Visit: Payer: Self-pay | Admitting: Family Medicine

## 2012-06-03 ENCOUNTER — Encounter: Payer: Self-pay | Admitting: Family Medicine

## 2012-06-03 ENCOUNTER — Ambulatory Visit (INDEPENDENT_AMBULATORY_CARE_PROVIDER_SITE_OTHER): Payer: Medicare HMO | Admitting: Family Medicine

## 2012-06-03 VITALS — BP 120/80 | Temp 98.4°F | Wt 176.0 lb

## 2012-06-03 DIAGNOSIS — E8941 Symptomatic postprocedural ovarian failure: Secondary | ICD-10-CM

## 2012-06-03 DIAGNOSIS — I10 Essential (primary) hypertension: Secondary | ICD-10-CM

## 2012-06-03 DIAGNOSIS — E785 Hyperlipidemia, unspecified: Secondary | ICD-10-CM

## 2012-06-03 MED ORDER — ATORVASTATIN CALCIUM 10 MG PO TABS: 10.0000 mg | ORAL_TABLET | Freq: Every day | ORAL | Status: DC

## 2012-06-03 MED ORDER — ESTROGENS CONJUGATED 0.625 MG PO TABS: ORAL_TABLET | ORAL | Status: DC

## 2012-06-03 NOTE — Progress Notes (Signed)
  Subjective:    Patient ID: Alison Garza, female    DOB: Jul 18, 1945, 67 y.o.   MRN: [id number]  HPI  Medical follow up  Hypertension treated with losartan and generally well-controlled. No orthostasis. Compliant with therapy. Denies side effects. Hyperlipidemia treated with simvastatin. Patient recently developed rash which she attributed to simvastatin. After discontinuing rash promptly resolved. She is reluctant to take this further. Previous palpitations with Crestor. Several years ago she is tolerating Lipitor without side effects but this was discontinued because of cost.  Patient is postmenopausal. Had seen gynecologist until recently. Because of insurance issues unable to see. Previous hysterectomy. Patient takes estrogen and testosterone combination. She's tried multiple times tapering estrogen but has had inability because of hot flashes. She is very reluctant to consider further tapering at this time even after discussion of potential risks. She does get yearly mammograms  Past Medical History  Diagnosis Date  . COLONIC POLYPS 05/29/2009  . HYPERLIPIDEMIA 02/29/2008  . GERD 05/29/2009  . OVERACTIVE BLADDER 02/29/2008  . MENOPAUSE, SURGICAL 02/29/2008  . Lumbago 03/12/2010  . Arthritis   . IBS (irritable bowel syndrome)    Past Surgical History  Procedure Laterality Date  . Cholecystectomy  1984  . Kidney surgery  1980    to rotate kidney  . Colon surgery  2006    polyp removed  . Appendectomy  1973  . Ovarian cyst removal  2009  . Cesarean section      x 2  . Abdominal hysterectomy  1976    fiboids    reports that she quit smoking about 44 years ago. Her smoking use included Cigarettes. She smoked 0.00 packs per day. She does not have any smokeless tobacco history on file. She reports that she does not drink alcohol or use illicit drugs. family history includes Cerebral aneurysm (age of onset: 58) in her brother; Deep vein thrombosis in her daughter; Hyperlipidemia in  her sister; Pancreatic cancer in her mother; and Stroke in an unspecified family member. Allergies  Allergen Reactions  . Ciprofloxacin     Upset Gi  . Codeine Nausea Only  . Sulfonamide Derivatives Nausea And Vomiting  . Penicillins Rash      Review of Systems  Constitutional: Negative for chills, appetite change, fatigue and unexpected weight change.  Eyes: Negative for visual disturbance.  Respiratory: Negative for cough, chest tightness, shortness of breath and wheezing.   Cardiovascular: Negative for chest pain, palpitations and leg swelling.  Neurological: Negative for dizziness, seizures, syncope, weakness, light-headedness and headaches.       Objective:   Physical Exam  Constitutional: She appears well-developed and well-nourished.  Neck: Neck supple. No thyromegaly present.  Cardiovascular: Normal rate and regular rhythm.   No murmur heard. Pulmonary/Chest: Effort normal and breath sounds normal. No respiratory distress. She has no wheezes. She has no rales.  Musculoskeletal: She exhibits no edema.  Lymphadenopathy:    She has no cervical adenopathy.          Assessment & Plan:  #1 hypertension. Stable at goal. Continue losartan #2 hyperlipidemia. Possible intolerance to simvastatin. Start Lipitor 10 mg daily and recheck lipids in approximately 2 months #3 postmenopausal hot flashes. We discussed tapering. She is very reluctant as above after discussion of risk and benefits. Premarin 0.625 mg once daily. We'll introduced her to the idea of slowly tapering over the next couple of years as tolerated

## 2012-06-15 ENCOUNTER — Telehealth: Payer: Self-pay | Admitting: Family Medicine

## 2012-06-15 NOTE — Telephone Encounter (Signed)
Patient calling about prescription for Premarin .625 mg that was sent to [facility] in [place] last week.   Is having medication coverage problem with insurance this year due to the plan they put her on.  When she went to get the prescription, found it is $90.00 per month.  Asking for less expensive medication to be called to pharmacy.    Can reach patient at [phone] until 3:30 pm today then must leave for short time, will be home again after 4:30 pm.

## 2012-06-15 NOTE — Telephone Encounter (Signed)
Estradiol and Estrace are both on preferred med list with pt [facility].  Currently she is on premarin .625 daily and needs something less costly

## 2012-06-16 MED ORDER — ESTRADIOL 1 MG PO TABS: 1.0000 mg | ORAL_TABLET | Freq: Every day | ORAL | Status: DC

## 2012-06-16 NOTE — Telephone Encounter (Signed)
Pt informed on VM Rx was sent to her pharmacy as requested

## 2012-06-16 NOTE — Telephone Encounter (Signed)
Pt states the Estradiol is what is approved by insurance and what she would like to try.  .5, .1, and 2mg  (highest).  Pt says the 2 mg is what they recommend for the dose she's on.Jordan Hawks, [address].  Pt would like to pick up today please.

## 2012-06-16 NOTE — Telephone Encounter (Signed)
We can stop Premarin and start Estradiol.  I would recommend she try 1 mg once daily. At her age, we should be considering tapering back anyway.

## 2012-07-24 ENCOUNTER — Ambulatory Visit (INDEPENDENT_AMBULATORY_CARE_PROVIDER_SITE_OTHER): Payer: Medicare HMO | Admitting: Family Medicine

## 2012-07-24 VITALS — BP 110/80 | Temp 98.8°F | Wt 181.0 lb

## 2012-07-24 DIAGNOSIS — M1712 Unilateral primary osteoarthritis, left knee: Secondary | ICD-10-CM

## 2012-07-24 DIAGNOSIS — M79609 Pain in unspecified limb: Secondary | ICD-10-CM

## 2012-07-24 DIAGNOSIS — M1710 Unilateral primary osteoarthritis, unspecified knee: Secondary | ICD-10-CM

## 2012-07-24 MED ORDER — MELOXICAM 15 MG PO TABS: 15.0000 mg | ORAL_TABLET | Freq: Every day | ORAL | Status: DC

## 2012-07-24 MED ORDER — METHYLPREDNISOLONE ACETATE 40 MG/ML IJ SUSP
40.0000 mg | Freq: Once | INTRAMUSCULAR | Status: AC
  Administered 2012-07-24: 40 mg via INTRAMUSCULAR

## 2012-07-24 NOTE — Progress Notes (Signed)
  Subjective:    Patient ID: Alison Garza, female    DOB: 24-May-1945, 67 y.o.   MRN: [id number]  HPI Left knee pain. History of osteoarthritis. No recent injury. Progressive pain over about the last 9 or 10 days. She's noticed some edema left lower extremity. Previous venous Doppler scan 2012 negative. Pain is mostly medial aspect left knee. No warmth. No erythema. No ecchymosis. No locking or giving way. Previously had corticosteroid injection at another facility few years ago which helped. She is taken some Advil without much improvement. Requesting injection.  Past Medical History  Diagnosis Date  . COLONIC POLYPS 05/29/2009  . HYPERLIPIDEMIA 02/29/2008  . GERD 05/29/2009  . OVERACTIVE BLADDER 02/29/2008  . MENOPAUSE, SURGICAL 02/29/2008  . Lumbago 03/12/2010  . Arthritis   . IBS (irritable bowel syndrome)    Past Surgical History  Procedure Laterality Date  . Cholecystectomy  1984  . Kidney surgery  1980    to rotate kidney  . Colon surgery  2006    polyp removed  . Appendectomy  1973  . Ovarian cyst removal  2009  . Cesarean section      x 2  . Abdominal hysterectomy  1976    fiboids    reports that she quit smoking about 44 years ago. Her smoking use included Cigarettes. She smoked 0.00 packs per day. She does not have any smokeless tobacco history on file. She reports that she does not drink alcohol or use illicit drugs. family history includes Cerebral aneurysm (age of onset: 38) in her brother; Deep vein thrombosis in her daughter; Hyperlipidemia in her sister; Pancreatic cancer in her mother; and Stroke in an unspecified family member. Allergies  Allergen Reactions  . Ciprofloxacin     Upset Gi  . Codeine Nausea Only  . Sulfonamide Derivatives Nausea And Vomiting  . Penicillins Rash      Review of Systems  Constitutional: Negative for fever and chills.  Respiratory: Negative for cough and shortness of breath.   Cardiovascular: Positive for leg swelling.  Negative for chest pain and palpitations.  Musculoskeletal: Positive for arthralgias.  Skin: Negative for rash.       Objective:   Physical Exam  Constitutional: She appears well-developed and well-nourished.  Cardiovascular: Normal rate and regular rhythm.   Pulmonary/Chest: Effort normal and breath sounds normal. No respiratory distress. She has no wheezes. She has no rales.  Musculoskeletal:  Left knee reveals no effusion. No warmth. Has tenderness over the medial compartment of the knee. Normal ligament testing. No calf tenderness. No pitting edema lower leg.  Neurological:  Full-strength lower extremities          Assessment & Plan:  Left knee pain. Suspect osteoarthritis. Patient not responding to Advil. Requesting corticosteroid injection. Discussed risk and benefits of steroid injection including risk of bleeding, bruising, infection. Prepped left knee with Betadine. Using 25-gauge 1 and 1/2 inch needle injected 2 cc of plain Xylocaine and 40 mg Depo-Medrol medial and inferior to patella. Patient tolerated well. Start meloxicam 15 mg once daily. Reassess 2 weeks.

## 2012-07-28 ENCOUNTER — Other Ambulatory Visit: Payer: Self-pay | Admitting: Family Medicine

## 2012-08-07 ENCOUNTER — Encounter: Payer: Self-pay | Admitting: Family Medicine

## 2012-08-07 ENCOUNTER — Ambulatory Visit (INDEPENDENT_AMBULATORY_CARE_PROVIDER_SITE_OTHER): Payer: Medicare HMO | Admitting: Family Medicine

## 2012-08-07 VITALS — BP 130/80 | HR 74 | Temp 98.1°F | Resp 20 | Wt 178.0 lb

## 2012-08-07 DIAGNOSIS — M25569 Pain in unspecified knee: Secondary | ICD-10-CM

## 2012-08-07 DIAGNOSIS — M25562 Pain in left knee: Secondary | ICD-10-CM

## 2012-08-07 DIAGNOSIS — I10 Essential (primary) hypertension: Secondary | ICD-10-CM

## 2012-08-07 DIAGNOSIS — E785 Hyperlipidemia, unspecified: Secondary | ICD-10-CM

## 2012-08-07 NOTE — Progress Notes (Signed)
  Subjective:    Patient ID: Alison Garza, female    DOB: 1945/07/15, 67 y.o.   MRN: [id number]  HPI Followup regarding left knee pain Suspected osteoarthritis. No recent injury. We performed steroid injection and she is at least 80% improved. She's ambulating better. Less edema. She has not noted any redness or warmth. She has not started and meloxicam. Still icing intermittently. No locking or giving way.  Past Medical History  Diagnosis Date  . COLONIC POLYPS 05/29/2009  . HYPERLIPIDEMIA 02/29/2008  . GERD 05/29/2009  . OVERACTIVE BLADDER 02/29/2008  . MENOPAUSE, SURGICAL 02/29/2008  . Lumbago 03/12/2010  . Arthritis   . IBS (irritable bowel syndrome)    Past Surgical History  Procedure Laterality Date  . Cholecystectomy  1984  . Kidney surgery  1980    to rotate kidney  . Colon surgery  2006    polyp removed  . Appendectomy  1973  . Ovarian cyst removal  2009  . Cesarean section      x 2  . Abdominal hysterectomy  1976    fiboids    reports that she quit smoking about 44 years ago. Her smoking use included Cigarettes. She smoked 0.00 packs per day. She does not have any smokeless tobacco history on file. She reports that she does not drink alcohol or use illicit drugs. family history includes Cerebral aneurysm (age of onset: 75) in her brother; Deep vein thrombosis in her daughter; Hyperlipidemia in her sister; Pancreatic cancer in her mother; and Stroke in an unspecified family member. Allergies  Allergen Reactions  . Ciprofloxacin     Upset Gi  . Codeine Nausea Only  . Sulfonamide Derivatives Nausea And Vomiting  . Penicillins Rash      Review of Systems  Constitutional: Negative for fever and chills.  Musculoskeletal: Negative for gait problem.       Objective:   Physical Exam  Constitutional: She appears well-developed and well-nourished.  Cardiovascular: Normal rate and regular rhythm.   Pulmonary/Chest: Effort normal and breath sounds normal. No  respiratory distress. She has no wheezes. She has no rales.  Musculoskeletal:  Left knee reveals no effusion. No warmth. No erythema. No ecchymosis. Mild medial joint line tenderness. Fluid range of motion.          Assessment & Plan:  Left knee pain. Suspect osteoarthritis. Greatly improved following steroid injection. Continue meloxicam as needed. If she has recurrent flare, obtain x-rays to further assess

## 2012-08-07 NOTE — Patient Instructions (Addendum)
Wear and Tear Disorders of the Knee (Arthritis, Osteoarthritis)  Everyone will experience wear and tear injuries (arthritis, osteoarthritis) of the knee. These are the changes we all get as we age. They come from the joint stress of daily living. The amount of cartilage damage in your knee and your symptoms determine if you need surgery. Mild problems require approximately two months recovery time. More severe problems take several months to recover. With mild problems, your surgeon may find worn and rough cartilage surfaces. With severe changes, your surgeon may find cartilage that has completely worn away and exposed the bone. Loose bodies of bone and cartilage, bone spurs (excess bone growth), and injuries to the menisci (cushions between the large bones of your leg) are also common. All of these problems can cause pain.  For a mild wear and tear problem, rough cartilage may simply need to be shaved and smoothed. For more severe problems with areas of exposed bone, your surgeon may use an instrument for roughing up the bone surfaces to stimulate new cartilage growth. Loose bodies are usually removed. Torn menisci may be trimmed or repaired.  ABOUT THE ARTHROSCOPIC PROCEDURE  Arthroscopy is a surgical technique. It allows your orthopedic surgeon to diagnose and treat your knee injury with accuracy. The surgeon looks into your knee through a small scope. The scope is like a small (pencil-sized) telescope. Arthroscopy is less invasive than open knee surgery. You can expect a more rapid recovery. After the procedure, you will be moved to a recovery area until most of the effects of the medication have worn off. Your caregiver will discuss the test results with you.  RECOVERY  The severity of the arthritis and the type of procedure performed will determine recovery time. Other important factors include age, physical condition, medical conditions, and the type of rehabilitation program. Strengthening your muscles after  arthroscopy helps guarantee a better recovery. Follow your caregiver's instructions. Use crutches, rest, elevate, ice, and do knee exercises as instructed. Your caregivers will help you and instruct you with exercises and other physical therapy required to regain your mobility, muscle strength, and functioning following surgery. Only take over-the-counter or prescription medicines for pain, discomfort, or fever as directed by your caregiver.   SEEK MEDICAL CARE IF:   · There is increased bleeding (more than a small spot) from the wound.  · You notice redness, swelling, or increasing pain in the wound.  · Pus is coming from wound.  · You develop an unexplained oral temperature above 102° F (38.9° C) , or as your caregiver suggests.  · You notice a foul smell coming from the wound or dressing.  · You have severe pain with motion of the knee.  SEEK IMMEDIATE MEDICAL CARE IF:   · You develop a rash.  · You have difficulty breathing.  · You have any allergic problems.  MAKE SURE YOU:   · Understand these instructions.  · Will watch your condition.  · Will get help right away if you are not doing well or get worse.  Document Released: 02/16/2000 Document Revised: 05/13/2011 Document Reviewed: 07/15/2007  ExitCare® Patient Information ©2014 [employer].

## 2012-08-19 ENCOUNTER — Telehealth: Payer: Self-pay | Admitting: Family Medicine

## 2012-08-19 NOTE — Telephone Encounter (Signed)
Closed in error/kh

## 2012-08-19 NOTE — Telephone Encounter (Signed)
Pt would like to know if it is OK for her to start "Slim Fast"? Pt is concerned about interactions w/ her BP med. Ps advise

## 2012-08-19 NOTE — Telephone Encounter (Signed)
Pt would like to know if it is OK for her to start "Slim Fast"? Pt is concerned about interactions w/ her BP med. Ps advise  

## 2012-08-20 NOTE — Telephone Encounter (Signed)
Pt informed.  She has OV scheduled for July and will bring a Slim Fast to appt to review.

## 2012-08-20 NOTE — Telephone Encounter (Signed)
i do not know all it's constituents so she would need to bring in list of ingredients to discuss, though my understanding is that it is just a low calorie nutritional supplement

## 2012-09-01 ENCOUNTER — Other Ambulatory Visit (INDEPENDENT_AMBULATORY_CARE_PROVIDER_SITE_OTHER): Payer: Medicare HMO

## 2012-09-01 ENCOUNTER — Encounter: Payer: Self-pay | Admitting: Family Medicine

## 2012-09-01 DIAGNOSIS — I10 Essential (primary) hypertension: Secondary | ICD-10-CM

## 2012-09-01 DIAGNOSIS — E785 Hyperlipidemia, unspecified: Secondary | ICD-10-CM

## 2012-09-01 LAB — BASIC METABOLIC PANEL
BUN: 12 mg/dL (ref 6–23)
Chloride: 105 mEq/L (ref 96–112)
Creatinine, Ser: 0.7 mg/dL (ref 0.4–1.2)
GFR: 107.31 mL/min (ref >60.00)
Glucose, Bld: 88 mg/dL (ref 70–99)
Potassium: 3.7 mEq/L (ref 3.5–5.1)

## 2012-09-01 LAB — HEPATIC FUNCTION PANEL
ALT: 32 U/L (ref 0–35)
Alkaline Phosphatase: 55 U/L (ref 39–117)
Bilirubin, Direct: 0.1 mg/dL (ref 0.0–0.3)
Total Bilirubin: 0.7 mg/dL (ref 0.3–1.2)

## 2012-09-01 LAB — LIPID PANEL: VLDL: 13.2 mg/dL (ref 0.0–40.0)

## 2012-09-03 ENCOUNTER — Ambulatory Visit (INDEPENDENT_AMBULATORY_CARE_PROVIDER_SITE_OTHER): Payer: Medicare HMO | Admitting: Family Medicine

## 2012-09-03 ENCOUNTER — Encounter: Payer: Self-pay | Admitting: Family Medicine

## 2012-09-03 VITALS — BP 120/78 | HR 84 | Temp 97.9°F | Ht 59.0 in | Wt 179.0 lb

## 2012-09-03 DIAGNOSIS — E785 Hyperlipidemia, unspecified: Secondary | ICD-10-CM

## 2012-09-03 DIAGNOSIS — K219 Gastro-esophageal reflux disease without esophagitis: Secondary | ICD-10-CM

## 2012-09-03 DIAGNOSIS — I10 Essential (primary) hypertension: Secondary | ICD-10-CM

## 2012-09-03 NOTE — Progress Notes (Signed)
  Subjective:    Patient ID: Alison Garza, female    DOB: January 12, 1946, 67 y.o.   MRN: [id number]  HPI Medical followup Patient has history of obesity, hypertension, hyperlipidemia, GERD. Osteoarthritis mostly involving knees. Recent steroid injection and knee continues to do well. She already belongs to the [facility] and plans to start exercising more soon She is discouraged with her current weight  Medications reviewed. Compliant with all. Recent labs reviewed with patient. Lipids are excellent. Electrolytes are normal. Blood pressures been stable. No orthostasis. No recent chest pains or dizziness. She has some mild edema usually late day. Tries to watch her sodium intake carefully  Past Medical History  Diagnosis Date  . COLONIC POLYPS 05/29/2009  . HYPERLIPIDEMIA 02/29/2008  . GERD 05/29/2009  . OVERACTIVE BLADDER 02/29/2008  . MENOPAUSE, SURGICAL 02/29/2008  . Lumbago 03/12/2010  . Arthritis   . IBS (irritable bowel syndrome)    Past Surgical History  Procedure Laterality Date  . Cholecystectomy  1984  . Kidney surgery  1980    to rotate kidney  . Colon surgery  2006    polyp removed  . Appendectomy  1973  . Ovarian cyst removal  2009  . Cesarean section      x 2  . Abdominal hysterectomy  1976    fiboids    reports that she quit smoking about 44 years ago. Her smoking use included Cigarettes. She smoked 0.00 packs per day. She does not have any smokeless tobacco history on file. She reports that she does not drink alcohol or use illicit drugs. family history includes Cerebral aneurysm (age of onset: 53) in her brother; Deep vein thrombosis in her daughter; Hyperlipidemia in her sister; Pancreatic cancer in her mother; and Stroke in an unspecified family member. Allergies  Allergen Reactions  . Ciprofloxacin     Upset Gi  . Codeine Nausea Only  . Sulfonamide Derivatives Nausea And Vomiting  . Penicillins Rash      Review of Systems  Constitutional: Negative for  fatigue.  HENT: Negative for trouble swallowing.   Eyes: Negative for visual disturbance.  Respiratory: Negative for cough, chest tightness, shortness of breath and wheezing.   Cardiovascular: Negative for chest pain, palpitations and leg swelling.  Gastrointestinal: Negative for abdominal pain.  Endocrine: Negative for polydipsia and polyuria.  Neurological: Negative for dizziness, seizures, syncope, weakness, light-headedness and headaches.       Objective:   Physical Exam  Vitals reviewed. Constitutional: She appears well-developed and well-nourished. No distress.  Neck: Neck supple. No thyromegaly present.  Cardiovascular: Normal rate and regular rhythm.   Pulmonary/Chest: Effort normal and breath sounds normal. No respiratory distress. She has no wheezes. She has no rales.  Musculoskeletal: She exhibits no edema.          Assessment & Plan:  #1 hypertension. Well controlled. Continue losartan #2 hyperlipidemia. Lipids well controlled with low-dose Lipitor. Continue current medication #3 GERD stable on omeprazole #4 obesity. Patient requesting weight loss drug. We've not recommended any appetite suppressants with her history of hypertension. We talked about strategies with establishing more consistent exercise and overall calorie and processed sugar reduction

## 2012-10-18 ENCOUNTER — Encounter (HOSPITAL_BASED_OUTPATIENT_CLINIC_OR_DEPARTMENT_OTHER): Payer: Self-pay | Admitting: *Deleted

## 2012-10-18 ENCOUNTER — Emergency Department (HOSPITAL_BASED_OUTPATIENT_CLINIC_OR_DEPARTMENT_OTHER)
Admission: EM | Admit: 2012-10-18 | Discharge: 2012-10-18 | Disposition: A | Discharge status: Home / Self Care | Payer: Medicare HMO | Attending: Emergency Medicine | Admitting: Emergency Medicine

## 2012-10-18 DIAGNOSIS — R51 Headache: Secondary | ICD-10-CM | POA: Insufficient documentation

## 2012-10-18 DIAGNOSIS — Z860100 Personal history of colon polyps, unspecified: Secondary | ICD-10-CM | POA: Insufficient documentation

## 2012-10-18 DIAGNOSIS — Z87891 Personal history of nicotine dependence: Secondary | ICD-10-CM | POA: Insufficient documentation

## 2012-10-18 DIAGNOSIS — M129 Arthropathy, unspecified: Secondary | ICD-10-CM | POA: Insufficient documentation

## 2012-10-18 DIAGNOSIS — K219 Gastro-esophageal reflux disease without esophagitis: Secondary | ICD-10-CM | POA: Insufficient documentation

## 2012-10-18 DIAGNOSIS — R109 Unspecified abdominal pain: Secondary | ICD-10-CM | POA: Insufficient documentation

## 2012-10-18 DIAGNOSIS — E785 Hyperlipidemia, unspecified: Secondary | ICD-10-CM | POA: Insufficient documentation

## 2012-10-18 DIAGNOSIS — Z79899 Other long term (current) drug therapy: Secondary | ICD-10-CM | POA: Insufficient documentation

## 2012-10-18 DIAGNOSIS — Z8719 Personal history of other diseases of the digestive system: Secondary | ICD-10-CM | POA: Insufficient documentation

## 2012-10-18 DIAGNOSIS — Z87448 Personal history of other diseases of urinary system: Secondary | ICD-10-CM | POA: Insufficient documentation

## 2012-10-18 DIAGNOSIS — Z8742 Personal history of other diseases of the female genital tract: Secondary | ICD-10-CM | POA: Insufficient documentation

## 2012-10-18 DIAGNOSIS — Z880 Allergy status to penicillin: Secondary | ICD-10-CM | POA: Insufficient documentation

## 2012-10-18 LAB — COMPREHENSIVE METABOLIC PANEL
AST: 29 U/L (ref 0–37)
Albumin: 3.7 g/dL (ref 3.5–5.2)
BUN: 14 mg/dL (ref 6–23)
CO2: 24 mEq/L (ref 19–32)
Calcium: 9.5 mg/dL (ref 8.4–10.5)
Creatinine, Ser: 0.7 mg/dL (ref 0.50–1.10)
GFR calc non Af Amer: 88 mL/min — ABNORMAL LOW (ref >90)

## 2012-10-18 LAB — URINALYSIS, ROUTINE W REFLEX MICROSCOPIC
Bilirubin Urine: NEGATIVE
Hgb urine dipstick: NEGATIVE
Ketones, ur: NEGATIVE mg/dL
Protein, ur: NEGATIVE mg/dL
Urobilinogen, UA: 1 mg/dL (ref 0.0–1.0)

## 2012-10-18 LAB — CBC WITH DIFFERENTIAL/PLATELET
Basophils Absolute: 0 10*3/uL (ref 0.0–0.1)
Eosinophils Absolute: 0.1 10*3/uL (ref 0.0–0.7)
Eosinophils Relative: 1 % (ref 0–5)
MCH: 28.3 pg (ref 26.0–34.0)
MCV: 84.6 fL (ref 78.0–100.0)
Monocytes Absolute: 0.7 10*3/uL (ref 0.1–1.0)
Platelets: 157 10*3/uL (ref 150–400)
RDW: 14.4 % (ref 11.5–15.5)

## 2012-10-18 LAB — LIPASE, BLOOD: Lipase: 19 U/L (ref 11–59)

## 2012-10-18 MED ORDER — PANTOPRAZOLE SODIUM 40 MG PO TBEC: 40.0000 mg | DELAYED_RELEASE_TABLET | Freq: Every day | ORAL | Status: DC

## 2012-10-18 MED ORDER — GI COCKTAIL ~~LOC~~
30.0000 mL | Freq: Once | ORAL | Status: AC
  Administered 2012-10-18: 30 mL via ORAL
  Filled 2012-10-18: qty 30

## 2012-10-18 NOTE — ED Notes (Signed)
Pt c/o uncontrolled heart burn x 2 days upper abd " burning"

## 2012-10-18 NOTE — ED Provider Notes (Signed)
CSN: [id number]     Arrival date & time 10/18/12  1420 History  This chart was scribed for Charles B. Bernette Mayers, MD by Ardelia Mems, ED Scribe. This patient was seen in room MH10/MH10 and the patient's care was started at 4:54 PM.   First MD Initiated Contact with Patient 10/18/12 1649     Chief Complaint  Patient presents with  . Heartburn  . Abdominal Pain    The history is provided by the patient. No language interpreter was used.   HPI Comments: Alison Garza is a 67 y.o. female with a history of GERD and gastric ulcers who presents to the Emergency Department complaining of gradual onset, gradually worsening, intermittent, moderate, "burning" epigastric and RUQ abdominal pain over the past 2 days. She states that this pain began as a burning pain in her throat, and has progressed to include her abdomen. She also states that she has sinus drainage, and she has woken up with mouthfuls of phlegm the past 2 mornings. She states that she feels like she has a "lump" in her throat, but she denies pain with swallowing. She also complains of an intemittent, mild generalized headache onset today. She also states that she has a history of gastric ulcers and GERD for which she Omeprazole 20 mg daily. She states that she took this today without relief of her pain. She also states that she has taken benadryl without relief. She states that she has a surgical abdominal history including cholecystectomy, appendectomy and hysterectomy. She denies fever, chills, nausea, vomiting or any other symptoms.   PCP- Dr. Evelena Peat Gastroenterologist- Dr. Marina Goodell with [facility]  Past Medical History  Diagnosis Date  . COLONIC POLYPS 05/29/2009  . HYPERLIPIDEMIA 02/29/2008  . GERD 05/29/2009  . OVERACTIVE BLADDER 02/29/2008  . MENOPAUSE, SURGICAL 02/29/2008  . Lumbago 03/12/2010  . Arthritis   . IBS (irritable bowel syndrome)    Past Surgical History  Procedure Laterality Date  . Cholecystectomy  1984  .  Kidney surgery  1980    to rotate kidney  . Colon surgery  2006    polyp removed  . Appendectomy  1973  . Ovarian cyst removal  2009  . Cesarean section      x 2  . Abdominal hysterectomy  1976    fiboids   Family History  Problem Relation Age of Onset  . Pancreatic cancer Mother   . Hyperlipidemia Sister   . Cerebral aneurysm Brother 60  . Deep vein thrombosis Daughter   . Stroke      maternal family   History  Substance Use Topics  . Smoking status: Former Smoker    Types: Cigarettes    Quit date: 03/04/1968  . Smokeless tobacco: Not on file  . Alcohol Use: No   OB History   Grav Para Term Preterm Abortions TAB SAB Ect Mult Living                 Review of Systems A complete 10 system review of systems was obtained and all systems are negative except as noted in the HPI and PMH.   Allergies  Ciprofloxacin; Codeine; Sulfonamide derivatives; and Penicillins  Home Medications   Current Outpatient Rx  Name  Route  Sig  Dispense  Refill  . atorvastatin (LIPITOR) 10 MG tablet   Oral   Take 1 tablet (10 mg total) by mouth daily.   90 tablet   3   . carboxymethylcellulose (REFRESH PLUS) 0.5 % SOLN  1 drop daily as needed.         Marland Kitchen estradiol (ESTRACE) 1 MG tablet   Oral   Take 1 tablet (1 mg total) by mouth daily.   30 tablet   5   . losartan (COZAAR) 50 MG tablet      TAKE ONE TABLET BY MOUTH EVERY DAY   30 tablet   11   . meloxicam (MOBIC) 15 MG tablet   Oral   Take 1 tablet (15 mg total) by mouth daily.   30 tablet   0   . Multiple Vitamin (MULITIVITAMIN WITH MINERALS) TABS   Oral   Take 1 tablet by mouth daily.           Marland Kitchen omeprazole (PRILOSEC OTC) 20 MG tablet   Oral   Take 1 tablet (20 mg total) by mouth 2 (two) times daily.   180 tablet   3   . oxybutynin (DITROPAN-XL) 5 MG 24 hr tablet   Oral   Take 1 tablet (5 mg total) by mouth daily as needed. For bladder spasms   30 tablet   11    Triage Vitals: BP 169/95  Pulse 83   Temp(Src) 98 F (36.7 C) (Oral)  Resp 16  Ht 4' 11.5" (1.511 m)  Wt 179 lb (81.194 kg)  BMI 35.56 kg/m2  SpO2 98%  Physical Exam  Nursing note and vitals reviewed. Constitutional: She is oriented to person, place, and time. She appears well-developed and well-nourished.  HENT:  Head: Normocephalic and atraumatic.  Mouth/Throat: No oropharyngeal exudate.  Eyes: EOM are normal. Pupils are equal, round, and reactive to light.  Neck: Normal range of motion. Neck supple.  Cardiovascular: Normal rate, normal heart sounds and intact distal pulses.   Pulmonary/Chest: Effort normal and breath sounds normal.  Abdominal: Bowel sounds are normal. She exhibits no distension. There is tenderness (epigastric, RUQ). There is no rebound and no guarding.  Musculoskeletal: Normal range of motion. She exhibits no edema and no tenderness.  Neurological: She is alert and oriented to person, place, and time. She has normal strength. No cranial nerve deficit or sensory deficit.  Skin: Skin is warm and dry. No rash noted.  Psychiatric: She has a normal mood and affect.    ED Course   Medications  gi cocktail (Maalox,Lidocaine,Donnatal) (30 mL Oral Given 10/18/12 1549)   Procedures (including critical care time)  DIAGNOSTIC STUDIES: Oxygen Saturation is 98% on RA, normal by my interpretation.    COORDINATION OF CARE: 4:58 PM- Pt advised of plan for diagnostic lab work pt agrees. Pt states that the GI cocktail has provided her some relief of her pain.   Labs Reviewed  CBC WITH DIFFERENTIAL - Abnormal; Notable for the following:    RBC 5.13 (*)    All other components within normal limits  COMPREHENSIVE METABOLIC PANEL - Abnormal; Notable for the following:    ALT 37 (*)    GFR calc non Af Amer 88 (*)    All other components within normal limits  URINALYSIS, ROUTINE W REFLEX MICROSCOPIC  LIPASE, BLOOD   No results found.  1. GERD (gastroesophageal reflux disease)     MDM   Date:  10/18/2012  Rate: 85  Rhythm: normal sinus rhythm  QRS Axis: normal  Intervals: normal  ST/T Wave abnormalities: normal  Conduction Disutrbances:none  Narrative Interpretation:   Old EKG Reviewed: unchanged  Labs reviewed and unremarkable. Pt with GERD symptoms, causing a globus sensation. Will switch from omeprazole to  Protonix, advised PCP and GI follow up for recheck and return for any other concerns.          I personally performed the services described in this documentation, which was scribed in my presence. The recorded information has been reviewed and is accurate.      Charles B. Bernette Mayers, MD 10/18/12 [phone]

## 2012-10-21 ENCOUNTER — Ambulatory Visit (INDEPENDENT_AMBULATORY_CARE_PROVIDER_SITE_OTHER): Payer: Medicare HMO | Admitting: Family Medicine

## 2012-10-21 ENCOUNTER — Other Ambulatory Visit: Payer: Self-pay

## 2012-10-21 ENCOUNTER — Encounter: Payer: Self-pay | Admitting: Family Medicine

## 2012-10-21 VITALS — BP 138/80 | HR 81 | Temp 98.1°F | Wt 178.0 lb

## 2012-10-21 DIAGNOSIS — I10 Essential (primary) hypertension: Secondary | ICD-10-CM

## 2012-10-21 DIAGNOSIS — E669 Obesity, unspecified: Secondary | ICD-10-CM | POA: Insufficient documentation

## 2012-10-21 DIAGNOSIS — K219 Gastro-esophageal reflux disease without esophagitis: Secondary | ICD-10-CM

## 2012-10-21 DIAGNOSIS — G44209 Tension-type headache, unspecified, not intractable: Secondary | ICD-10-CM

## 2012-10-21 MED ORDER — ESTRADIOL 1 MG PO TABS: 1.0000 mg | ORAL_TABLET | Freq: Every day | ORAL | Status: DC

## 2012-10-21 MED ORDER — PANTOPRAZOLE SODIUM 40 MG PO TBEC: 40.0000 mg | DELAYED_RELEASE_TABLET | Freq: Every day | ORAL | Status: DC

## 2012-10-21 NOTE — Patient Instructions (Addendum)
Gastroesophageal Reflux Disease, Adult  Gastroesophageal reflux disease (GERD) happens when acid from your stomach flows up into the esophagus. When acid comes in contact with the esophagus, the acid causes soreness (inflammation) in the esophagus. Over time, GERD may create small holes (ulcers) in the lining of the esophagus.  CAUSES   · Increased body weight. This puts pressure on the stomach, making acid rise from the stomach into the esophagus.  · Smoking. This increases acid production in the stomach.  · Drinking alcohol. This causes decreased pressure in the lower esophageal sphincter (valve or ring of muscle between the esophagus and stomach), allowing acid from the stomach into the esophagus.  · Late evening meals and a full stomach. This increases pressure and acid production in the stomach.  · A malformed lower esophageal sphincter.  Sometimes, no cause is found.  SYMPTOMS   · Burning pain in the lower part of the mid-chest behind the breastbone and in the mid-stomach area. This may occur twice a week or more often.  · Trouble swallowing.  · Sore throat.  · Dry cough.  · Asthma-like symptoms including chest tightness, shortness of breath, or wheezing.  DIAGNOSIS   Your caregiver may be able to diagnose GERD based on your symptoms. In some cases, X-rays and other tests may be done to check for complications or to check the condition of your stomach and esophagus.  TREATMENT   Your caregiver may recommend over-the-counter or prescription medicines to help decrease acid production. Ask your caregiver before starting or adding any new medicines.   HOME CARE INSTRUCTIONS   · Change the factors that you can control. Ask your caregiver for guidance concerning weight loss, quitting smoking, and alcohol consumption.  · Avoid foods and drinks that make your symptoms worse, such as:  · Caffeine or alcoholic drinks.  · Chocolate.  · Peppermint or mint flavorings.  · Garlic and onions.  · Spicy foods.  · Citrus fruits,  such as oranges, lemons, or limes.  · Tomato-based foods such as sauce, chili, salsa, and pizza.  · Fried and fatty foods.  · Avoid lying down for the 3 hours prior to your bedtime or prior to taking a nap.  · Eat small, frequent meals instead of large meals.  · Wear loose-fitting clothing. Do not wear anything tight around your waist that causes pressure on your stomach.  · Raise the head of your bed 6 to 8 inches with wood blocks to help you sleep. Extra pillows will not help.  · Only take over-the-counter or prescription medicines for pain, discomfort, or fever as directed by your caregiver.  · Do not take aspirin, ibuprofen, or other nonsteroidal anti-inflammatory drugs (NSAIDs).  SEEK IMMEDIATE MEDICAL CARE IF:   · You have pain in your arms, neck, jaw, teeth, or back.  · Your pain increases or changes in intensity or duration.  · You develop nausea, vomiting, or sweating (diaphoresis).  · You develop shortness of breath, or you faint.  · Your vomit is green, yellow, black, or looks like coffee grounds or blood.  · Your stool is red, bloody, or black.  These symptoms could be signs of other problems, such as heart disease, gastric bleeding, or esophageal bleeding.  MAKE SURE YOU:   · Understand these instructions.  · Will watch your condition.  · Will get help right away if you are not doing well or get worse.  Document Released: 11/28/2004 Document Revised: 05/13/2011 Document Reviewed: 09/07/2010  ExitCare® Patient   Information ©2014 [employer].

## 2012-10-21 NOTE — Progress Notes (Signed)
Subjective:    Patient ID: Alison Garza, female    DOB: 1945-11-21, 67 y.o.   MRN: [id number]  HPI Patient here for ER followup. Her chronic medical problems include history of GERD, hypertension, obesity, hyperlipidemia. She went to the emergency department on 10/18/2012.  Notes reviewed She presented there with gradual worsening burning sensation epigastric and right upper quadrant. Previous cholecystectomy. She also had sensation of globus in her throat-but no true dysphagia. She has history of gastric ulcers and GERD and takes omeprazole 20 mg daily. Her labs in emergency room were unremarkable. She was given GI cocktail and symptoms improved She was switched from omeprazole to Protonix 40 mg once daily. Symptoms are currently stable low she still has occasional mild burning epigastric region. She does take occasional Aleve. In retrospect, she thinks symptoms were aggravated by chocolate and caffeine. No melena and no hematemesis. No recent appetite or weight changes  Hypertension has generally been well controlled. She did have elevated blood pressure 169/95 in emergency department but was having abdominal pain during that time. She takes losartan and compliant with therapy. She's had occasional bifrontal headaches which are goal and she thinks tension related.  Past Medical History  Diagnosis Date  . COLONIC POLYPS 05/29/2009  . HYPERLIPIDEMIA 02/29/2008  . GERD 05/29/2009  . OVERACTIVE BLADDER 02/29/2008  . MENOPAUSE, SURGICAL 02/29/2008  . Lumbago 03/12/2010  . Arthritis   . IBS (irritable bowel syndrome)    Past Surgical History  Procedure Laterality Date  . Cholecystectomy  1984  . Kidney surgery  1980    to rotate kidney  . Colon surgery  2006    polyp removed  . Appendectomy  1973  . Ovarian cyst removal  2009  . Cesarean section      x 2  . Abdominal hysterectomy  1976    fiboids    reports that she quit smoking about 44 years ago. Her smoking use included  Cigarettes. She smoked 0.00 packs per day. She does not have any smokeless tobacco history on file. She reports that she does not drink alcohol or use illicit drugs. family history includes Cerebral aneurysm (age of onset: 68) in her brother; Deep vein thrombosis in her daughter; Hyperlipidemia in her sister; Pancreatic cancer in her mother; Stroke in an other family member. Allergies  Allergen Reactions  . Ciprofloxacin     Upset Gi  . Codeine Nausea Only  . Sulfonamide Derivatives Nausea And Vomiting  . Penicillins Rash      Review of Systems  Constitutional: Negative for chills, fatigue and unexpected weight change.  Eyes: Negative for visual disturbance.  Respiratory: Negative for cough, chest tightness, shortness of breath and wheezing.   Cardiovascular: Negative for chest pain, palpitations and leg swelling.  Gastrointestinal: Positive for abdominal pain. Negative for nausea, vomiting and blood in stool.  Genitourinary: Negative for dysuria.  Neurological: Positive for headaches. Negative for dizziness, seizures, syncope, weakness and light-headedness.       Objective:   Physical Exam  Constitutional: She appears well-developed and well-nourished.  Eyes: Pupils are equal, round, and reactive to light.  Neck: Neck supple.  Cardiovascular: Normal rate and regular rhythm.  Exam reveals no gallop.   Pulmonary/Chest: Effort normal and breath sounds normal. No respiratory distress. She has no wheezes. She has no rales.  Abdominal: Soft. Bowel sounds are normal. She exhibits no distension and no mass. There is no rebound and no guarding.  Minimal epigastric tenderness. No guarding or rebound. No mass.  Musculoskeletal: She exhibits no edema.          Assessment & Plan:  #1 epigastric pain. History of GERD. Continue protonix with prescription provided. Dietary factors discussed. Denies caffeine use. She was instructed to schedule followup with gastroenterology and she is  encouraged to do so. Avoid nonsteroidals. #2 hypertension. Stable on followup. Continue current regimen. Continue weight loss efforts. #3 intermittent tension type headache. Try Tylenol. Avoid nonsteroidals. Avoid regular use of analgesics

## 2012-10-23 ENCOUNTER — Ambulatory Visit (INDEPENDENT_AMBULATORY_CARE_PROVIDER_SITE_OTHER): Payer: Medicare HMO | Admitting: Internal Medicine

## 2012-10-23 ENCOUNTER — Encounter: Payer: Self-pay | Admitting: Internal Medicine

## 2012-10-23 VITALS — BP 120/88 | HR 92 | Ht <= 58 in | Wt 176.2 lb

## 2012-10-23 DIAGNOSIS — R1013 Epigastric pain: Secondary | ICD-10-CM

## 2012-10-23 DIAGNOSIS — K219 Gastro-esophageal reflux disease without esophagitis: Secondary | ICD-10-CM

## 2012-10-23 DIAGNOSIS — Z86010 Personal history of colonic polyps: Secondary | ICD-10-CM

## 2012-10-23 NOTE — Patient Instructions (Addendum)

## 2012-10-23 NOTE — Progress Notes (Signed)
HISTORY OF PRESENT ILLNESS:  Alison Garza is a 67 y.o. female with a history of GERD, gastric ulcer, adenomatous colon polyps, and chronic functional abdominal complaints. She is status post cholecystectomy and hysterectomy. She presents with a constellation of dyspeptic symptoms which have occurred over the past several weeks. She describes epigastric discomfort, reflux with regurgitation, and constipation. She uses significant NSAIDs for hip and back pain. Also headaches. She has been on omeprazole 40 mg daily. Recently added ranitidine. She describes globus sensation. She was seen in the emergency room 5 days ago. Change to Protonix 40 mg daily. She feels that she is no worse. Laboratories from 10/18/2012 reveal essential normal comprehensive metabolic panel and normal CBC. She was also seen by her primary provider 2 days ago. Encouraged to keep GI followup. Also minimize or avoid NSAIDs. Her last colonoscopy and upper endoscopy were performed 10/04/2010. Colonoscopy revealed 2 polyps which were removed and found to be adenomatous. Followup in 5 years recommended. Upper endoscopy revealed an incidental duodenal diverticulum, but otherwise normal. No active ulcers. Probably lower GI complaint is that of constipation. No bleeding.  REVIEW OF SYSTEMS:  All non-GI ROS negative except for sinus and allergy trouble, arthritis, back pain, cough, headaches, muscle cramps, urinary leakage and increased urinary frequency  Past Medical History  Diagnosis Date  . COLONIC POLYPS 05/29/2009  . HYPERLIPIDEMIA 02/29/2008  . GERD 05/29/2009  . OVERACTIVE BLADDER 02/29/2008  . MENOPAUSE, SURGICAL 02/29/2008  . Lumbago 03/12/2010  . Arthritis   . IBS (irritable bowel syndrome)   . Sciatic nerve pain     right  . Ruptured lumbar disc     Past Surgical History  Procedure Laterality Date  . Cholecystectomy  1984  . Kidney surgery  1980    to rotate kidney  . Colon surgery  2006    polyp removed  .  Appendectomy  1973  . Ovarian cyst removal  2009  . Cesarean section      x 2  . Abdominal hysterectomy  1976    fiboids    Social History Averlee K Heinicke  reports that she quit smoking about 44 years ago. Her smoking use included Cigarettes. She smoked 0.00 packs per day. She does not have any smokeless tobacco history on file. She reports that she does not drink alcohol or use illicit drugs.  family history includes Cerebral aneurysm (age of onset: 30) in her brother; Deep vein thrombosis in her daughter; Hyperlipidemia in her sister; Pancreatic cancer in her mother; Stroke in an other family member.  Allergies  Allergen Reactions  . Ciprofloxacin     Upset Gi  . Codeine Nausea Only  . Sulfonamide Derivatives Nausea And Vomiting  . Penicillins Rash       PHYSICAL EXAMINATION: Vital signs: BP 120/88  Pulse 92  Ht 4\' 10"  (1.473 m)  Wt 176 lb 4 oz (79.946 kg)  BMI 36.85 kg/m2 General: Well-developed, well-nourished, no acute distress HEENT: Sclerae are anicteric, conjunctiva pink. Oral mucosa intact Lungs: Clear Heart: Regular Abdomen: soft, obese, mild epigastric discomfort with palpation, nondistended, no obvious ascites, no peritoneal signs, normal bowel sounds. No organomegaly. Extremities: No edema Psychiatric: alert and oriented x3. Cooperative   ASSESSMENT:  #1. GERD. Suspect this is responsible for globus symptoms and regurgitation. #2. Epigastric discomfort. Possibly NSAID intolerance versus recurrent ulcer #3. History of adenomatous colon polyps. Last colonoscopy August 2012  PLAN:  #1. Avoid NSAIDs #2. Continue Protonix #3. Diagnostic upper endoscopy.The nature of the procedure, as  well as the risks, benefits, and alternatives were carefully and thoroughly reviewed with the patient. Ample time for discussion and questions allowed. The patient understood, was satisfied, and agreed to proceed. #4. Surveillance colonoscopy around August 2017 #5. Ongoing  general medical care with PCP

## 2012-10-26 ENCOUNTER — Encounter: Payer: Self-pay | Admitting: Internal Medicine

## 2012-10-30 ENCOUNTER — Encounter: Payer: Self-pay | Admitting: Internal Medicine

## 2012-10-30 ENCOUNTER — Ambulatory Visit (AMBULATORY_SURGERY_CENTER): Payer: Medicare HMO | Admitting: Internal Medicine

## 2012-10-30 VITALS — BP 130/76 | HR 60 | Temp 96.6°F | Resp 34 | Ht <= 58 in | Wt 176.0 lb

## 2012-10-30 DIAGNOSIS — K219 Gastro-esophageal reflux disease without esophagitis: Secondary | ICD-10-CM

## 2012-10-30 DIAGNOSIS — R1013 Epigastric pain: Secondary | ICD-10-CM

## 2012-10-30 MED ORDER — SODIUM CHLORIDE 0.9 % IV SOLN: 500.0000 mL | INTRAVENOUS | Status: DC

## 2012-10-30 NOTE — Progress Notes (Signed)
No egg or soy allergy. ewm No problems with past sedation. ewm 

## 2012-10-30 NOTE — Progress Notes (Signed)
Procedure ends, to recovery, report given and VSS. 

## 2012-10-30 NOTE — Progress Notes (Addendum)
Patient did not have preoperative order for IV antibiotic SSI prophylaxis. (G8918)  Patient did not experience any of the following events: a burn prior to discharge; a fall within the facility; wrong site/side/patient/procedure/implant event; or a hospital transfer or hospital admission upon discharge from the facility. (G8907)  

## 2012-10-30 NOTE — Op Note (Signed)
[facility] [address] [place], [address]   ENDOSCOPY PROCEDURE REPORT  PATIENT: Iran, Kievit  MR#: [id number] BIRTHDATE: June 22, 1945 , 67  yrs. old GENDER: Female ENDOSCOPIST: Roxy Cedar, MD REFERRED BY:  .  Office PROCEDURE DATE:  10/30/2012 PROCEDURE:  EGD, diagnostic ASA CLASS:     Class II INDICATIONS:  Epigastric pain.   History of esophageal reflux. MEDICATIONS: MAC sedation, administered by CRNA and propofol (Diprivan) 150mg  IV TOPICAL ANESTHETIC: none  DESCRIPTION OF PROCEDURE: After the risks benefits and alternatives of the procedure were thoroughly explained, informed consent was obtained.  The LB [id number] endoscope was introduced through the mouth and advanced to the second portion of the duodenum. Without limitations.  The instrument was slowly withdrawn as the mucosa was fully examined.      EXAM: The upper, middle and distal third of the esophagus were carefully inspected and no abnormalities were noted.  The z-line was well seen at the GEJ.  The endoscope was pushed into the fundus which was normal including a retroflexed view.  The antrum, gastric body, first and second part of the duodenum were unremarkable. Retroflexed views revealed a hiatal hernia.     The scope was then withdrawn from the patient and the procedure completed.  COMPLICATIONS: There were no complications. ENDOSCOPIC IMPRESSION: 1. Normal EGD 2. GERD  RECOMMENDATIONS: 1.  Anti-reflux regimen to be followed 2.  Continue PPI  REPEAT EXAM:  eSigned:  Roxy Cedar, MD 10/30/2012 11:58 AM   IO:NGEXB Caryl Never, MD and The Patient

## 2012-10-30 NOTE — Patient Instructions (Addendum)
YOU HAD AN ENDOSCOPIC PROCEDURE TODAY AT THE [facility]: Refer to the procedure report that was given to you for any specific questions about what was found during the examination.  If the procedure report does not answer your questions, please call your gastroenterologist to clarify.  If you requested that your care partner not be given the details of your procedure findings, then the procedure report has been included in a sealed envelope for you to review at your convenience later.  YOU SHOULD EXPECT: Some feelings of bloating in the abdomen. Passage of more gas than usual.  Walking can help get rid of the air that was put into your GI tract during the procedure and reduce the bloating.   DIET: Your first meal following the procedure should be a light meal and then it is ok to progress to your normal diet.  A half-sandwich or bowl of soup is an example of a good first meal.  Heavy or fried foods are harder to digest and may make you feel nauseous or bloated.  Likewise meals heavy in dairy and vegetables can cause extra gas to form and this can also increase the bloating.  Drink plenty of fluids but you should avoid alcoholic beverages for 24 hours.  ACTIVITY: Your care partner should take you home directly after the procedure.  You should plan to take it easy, moving slowly for the rest of the day.  You can resume normal activity the day after the procedure however you should NOT DRIVE or use heavy machinery for 24 hours (because of the sedation medicines used during the test).    SYMPTOMS TO REPORT IMMEDIATELY: A gastroenterologist can be reached at any hour.  During normal business hours, 8:30 AM to 5:00 PM Monday through Friday, call [phone].  After hours and on weekends, please call the GI answering service at [phone] who will take a message and have the physician on call contact you.   Following upper endoscopy (EGD)  Vomiting of blood or coffee ground material  New  chest pain or pain under the shoulder blades  Painful or persistently difficult swallowing  New shortness of breath  Fever of 100F or higher  Black, tarry-looking stools  FOLLOW UP: If any biopsies were taken you will be contacted by phone or by letter within the next 1-3 weeks.  Call your gastroenterologist if you have not heard about the biopsies in 3 weeks.  Our staff will call the home number listed on your records the next business day following your procedure to check on you and address any questions or concerns that you may have at that time regarding the information given to you following your procedure. This is a courtesy call and so if there is no answer at the home number and we have not heard from you through the emergency physician on call, we will assume that you have returned to your regular daily activities without incident.  SIGNATURES/CONFIDENTIALITY: You and/or your care partner have signed paperwork which will be entered into your electronic medical record.  These signatures attest to the fact that that the information above on your After Visit Summary has been reviewed and is understood.  Full responsibility of the confidentiality of this discharge information lies with you and/or your care-partner.   Continue your reflux medications per Dr. Marina Goodell.

## 2012-11-03 ENCOUNTER — Telehealth: Payer: Self-pay | Admitting: *Deleted

## 2012-11-03 NOTE — Telephone Encounter (Signed)
  Follow up Call-  Call back number 10/30/2012 10/04/2010  Post procedure Call Back phone  # [phone]  Permission to leave phone message Yes -     Patient questions:  Do you have a fever, pain , or abdominal swelling? no Pain Score  0 *  Have you tolerated food without any problems? no  Have you been able to return to your normal activities? yes  Do you have any questions about your discharge instructions: Diet   no Medications  no Follow up visit  no  Do you have questions or concerns about your Care? no  Actions: * If pain score is 4 or above: No action needed, pain <4.

## 2012-11-13 ENCOUNTER — Ambulatory Visit (INDEPENDENT_AMBULATORY_CARE_PROVIDER_SITE_OTHER): Payer: Medicare HMO | Admitting: Family Medicine

## 2012-11-13 ENCOUNTER — Encounter: Payer: Self-pay | Admitting: Family Medicine

## 2012-11-13 VITALS — BP 136/84 | HR 84 | Temp 98.3°F | Wt 178.0 lb

## 2012-11-13 DIAGNOSIS — L259 Unspecified contact dermatitis, unspecified cause: Secondary | ICD-10-CM

## 2012-11-13 MED ORDER — TRIAMCINOLONE ACETONIDE 0.1 % EX CREA: TOPICAL_CREAM | Freq: Two times a day (BID) | CUTANEOUS | Status: DC

## 2012-11-13 NOTE — Progress Notes (Signed)
Chief Complaint  Patient presents with  . Rash    On both legs.  Ongoing for 1-2 weeks.  Has a burning sensation.    HPI:  Alison Garza is a 67 yo pt of Dr. Caryl Never here for an acute visit for:  1) Rash on leg: -for 2 weeks after switching to dial soap and a new bath and body works lotion - "cool kiss" -she has had reaction to a soap similar to this in the past -itchy bumps on bilat legs -denies fevers, malaise, chillse  ROS: See pertinent positives and negatives per HPI.  Past Medical History  Diagnosis Date  . COLONIC POLYPS 05/29/2009  . HYPERLIPIDEMIA 02/29/2008  . GERD 05/29/2009  . OVERACTIVE BLADDER 02/29/2008  . MENOPAUSE, SURGICAL 02/29/2008  . Lumbago 03/12/2010  . Arthritis   . IBS (irritable bowel syndrome)   . Sciatic nerve pain     right  . Ruptured lumbar disc     Past Surgical History  Procedure Laterality Date  . Cholecystectomy  1984  . Kidney surgery  1980    to rotate kidney  . Colon surgery  2006    polyp removed  . Appendectomy  1973  . Ovarian cyst removal  2009  . Cesarean section      x 2  . Abdominal hysterectomy  1976    fiboids    Family History  Problem Relation Age of Onset  . Pancreatic cancer Mother   . Hyperlipidemia Sister   . Cerebral aneurysm Brother 60  . Deep vein thrombosis Daughter   . Stroke      maternal family  . Colon cancer Neg Hx   . Esophageal cancer Neg Hx     History   Social History  . Marital Status: Married    Spouse Name: N/A    Number of Children: 2  . Years of Education: N/A   Occupational History  . retired    Social History Main Topics  . Smoking status: Former Smoker    Types: Cigarettes    Quit date: 03/04/1968  . Smokeless tobacco: Never Used  . Alcohol Use: No  . Drug Use: No  . Sexual Activity: None   Other Topics Concern  . None   Social History Narrative  . None    Current outpatient prescriptions:atorvastatin (LIPITOR) 10 MG tablet, Take 1 tablet (10 mg total)  by mouth daily., Disp: 90 tablet, Rfl: 3;  carboxymethylcellulose (REFRESH PLUS) 0.5 % SOLN, 1 drop daily as needed., Disp: , Rfl: ;  cyclobenzaprine (FLEXERIL) 5 MG tablet, Take 5 mg by mouth 2 (two) times daily as needed. , Disp: , Rfl: ;  estradiol (ESTRACE) 1 MG tablet, Take 1 tablet (1 mg total) by mouth daily., Disp: 30 tablet, Rfl: 5 losartan (COZAAR) 50 MG tablet, TAKE ONE TABLET BY MOUTH EVERY DAY, Disp: 30 tablet, Rfl: 11;  Multiple Vitamin (MULITIVITAMIN WITH MINERALS) TABS, Take 1 tablet by mouth daily.  , Disp: , Rfl: ;  oxybutynin (DITROPAN-XL) 5 MG 24 hr tablet, Take 1 tablet (5 mg total) by mouth daily as needed. For bladder spasms, Disp: 30 tablet, Rfl: 11;  pantoprazole (PROTONIX) 40 MG tablet, Take 1 tablet (40 mg total) by mouth daily., Disp: 30 tablet, Rfl: 11 triamcinolone cream (KENALOG) 0.1 %, Apply topically 2 (two) times daily. For 1 week, Disp: 30 g, Rfl: 0;  [DISCONTINUED] omeprazole (PRILOSEC OTC) 20 MG tablet, Take 1 tablet (20 mg total) by mouth 2 (two) times daily., Disp:  180 tablet, Rfl: 3;  [DISCONTINUED] oxybutynin (DITROPAN-XL) 5 MG 24 hr tablet, Take 5 mg by mouth daily as needed. For bladder spasms, Disp: , Rfl:   EXAM:  Filed Vitals:   11/13/12 1355  BP: 136/84  Pulse: 84  Temp: 98.3 F (36.8 C)    Body mass index is 37.21 kg/(m^2).  GENERAL: vitals reviewed and listed above, alert, oriented, appears well hydrated and in no acute distress  HEENT: atraumatic, conjunttiva clear, no obvious abnormalities on inspection of external nose and ears  NECK: no obvious masses on inspection  SKIN: dry skin with fine papular rash barely visible on LE bilat  MS: moves all extremities without noticeable abnormality  PSYCH: pleasant and cooperative, no obvious depression or anxiety  ASSESSMENT AND PLAN:  Discussed the following assessment and plan:  Contact dermatitis - Plan: triamcinolone cream (KENALOG) 0.1 %  -likely rx to new lotion and harsh dial  soap -Recommendations per orders and instructions, risks and use of medications and return precautions discussed. -Patient advised to return or notify a doctor immediately if symptoms worsen or persist or new concerns arise.  Patient Instructions  -only use hypoallergenic soap for bathing (dove and aveeno)  -only use cerave cream as a moisterizer once daily  -use hypoallergenic laundry soap  -use the steroid cream (triamcinilone) twice daily for 1 week on itchy area  -follow up with your doctor in 2-3 weeks if not resolving or sooner if worsening     Garza, Alison R.

## 2012-11-13 NOTE — Patient Instructions (Signed)
-  only use hypoallergenic soap for bathing (dove and aveeno)  -only use cerave cream as a moisterizer once daily  -use hypoallergenic laundry soap  -use the steroid cream (triamcinilone) twice daily for 1 week on itchy area  -follow up with your doctor in 2-3 weeks if not resolving or sooner if worsening

## 2012-11-17 ENCOUNTER — Ambulatory Visit (INDEPENDENT_AMBULATORY_CARE_PROVIDER_SITE_OTHER): Payer: Medicare HMO | Admitting: Family Medicine

## 2012-11-17 ENCOUNTER — Encounter: Payer: Self-pay | Admitting: Family Medicine

## 2012-11-17 VITALS — BP 120/80 | HR 93 | Temp 98.1°F | Wt 180.0 lb

## 2012-11-17 DIAGNOSIS — R21 Rash and other nonspecific skin eruption: Secondary | ICD-10-CM

## 2012-11-17 MED ORDER — METHYLPREDNISOLONE ACETATE 80 MG/ML IJ SUSP
80.0000 mg | Freq: Once | INTRAMUSCULAR | Status: AC
  Administered 2012-11-17: 80 mg via INTRAMUSCULAR

## 2012-11-17 NOTE — Patient Instructions (Addendum)
Continue with gentle soap such as Dove Consider Aveeno soap for itching May continue triamcinolone cream for another one week as needed

## 2012-11-17 NOTE — Progress Notes (Signed)
  Subjective:    Patient ID: Alison Garza, female    DOB: 05-11-1945, 67 y.o.   MRN: [id number]  HPI Patient is seen persistent pruritic rash lower extremities. Recent note reviewed. She was prescribed triamcinolone cream and instructed to discontinue Dial soap use less harsh soap such as Dove. Patient has also been using moisturizer-Cerave.  She has not seen any improvement. She has small pruritic follicular lesions on lower extremities. Sparing of the upper extremities and trunk. She has moderate pruritus.  She does not have any pets and denies any type of bites. No new change in medications.  Past Medical History  Diagnosis Date  . COLONIC POLYPS 05/29/2009  . HYPERLIPIDEMIA 02/29/2008  . GERD 05/29/2009  . OVERACTIVE BLADDER 02/29/2008  . MENOPAUSE, SURGICAL 02/29/2008  . Lumbago 03/12/2010  . Arthritis   . IBS (irritable bowel syndrome)   . Sciatic nerve pain     right  . Ruptured lumbar disc    Past Surgical History  Procedure Laterality Date  . Cholecystectomy  1984  . Kidney surgery  1980    to rotate kidney  . Colon surgery  2006    polyp removed  . Appendectomy  1973  . Ovarian cyst removal  2009  . Cesarean section      x 2  . Abdominal hysterectomy  1976    fiboids    reports that she quit smoking about 44 years ago. Her smoking use included Cigarettes. She smoked 0.00 packs per day. She has never used smokeless tobacco. She reports that she does not drink alcohol or use illicit drugs. family history includes Cerebral aneurysm (age of onset: 14) in her brother; Deep vein thrombosis in her daughter; Hyperlipidemia in her sister; Pancreatic cancer in her mother; Stroke in an other family member. There is no history of Colon cancer or Esophageal cancer. Allergies  Allergen Reactions  . Ciprofloxacin     Upset Gi  . Codeine Nausea Only  . Sulfonamide Derivatives Nausea And Vomiting  . Penicillins Rash      Review of Systems  Constitutional: Negative for fever  and chills.  Skin: Positive for rash.       Objective:   Physical Exam  Constitutional: She appears well-developed and well-nourished.  Cardiovascular: Normal rate and regular rhythm.   Pulmonary/Chest: Effort normal and breath sounds normal. No respiratory distress. She has no wheezes. She has no rales.  Skin: Rash noted.  Patient has nonspecific small follicular rash with slightly raised follicles lower extremities. No pustules. Nontender. Few excoriations          Assessment & Plan:  Nonspecific follicular rash lower extremities. Etiology unclear. Patient has tried antihistamines and topical steroid without improvement. Suggested Aveeno soap for symptom relief. Depo-Medrol 80 mg IM given. Touch base one to 2 weeks if not improving

## 2012-12-07 ENCOUNTER — Encounter: Payer: Self-pay | Admitting: Family Medicine

## 2012-12-10 ENCOUNTER — Encounter: Payer: Self-pay | Admitting: Family Medicine

## 2012-12-10 ENCOUNTER — Ambulatory Visit (INDEPENDENT_AMBULATORY_CARE_PROVIDER_SITE_OTHER): Payer: Medicare HMO | Admitting: Family Medicine

## 2012-12-10 VITALS — BP 130/80 | HR 97 | Temp 98.0°F | Wt 182.0 lb

## 2012-12-10 DIAGNOSIS — R21 Rash and other nonspecific skin eruption: Secondary | ICD-10-CM

## 2012-12-10 MED ORDER — DOXYCYCLINE HYCLATE 100 MG PO CAPS: 100.0000 mg | ORAL_CAPSULE | Freq: Two times a day (BID) | ORAL | Status: DC

## 2012-12-10 NOTE — Patient Instructions (Signed)
We will call you with dermatology appointment. 

## 2012-12-10 NOTE — Progress Notes (Signed)
  Subjective:    Patient ID: Alison Garza, female    DOB: 26-Apr-1945, 67 y.o.   MRN: [id number]  HPI Patient seen with persistent pruritic follicular type rash lower extremities. Present for several weeks. She's tried multiple things including topical steroids, moisturizers, oral Benadryl, Aveeno soap without improvement She had Depo-Medrol injection last visit which did not help. She continues to have sparing of upper extremities and trunk. Her rash is papular/follicular and involves only legs. Moderate pruritus. Symptoms somewhat worse at night. No clear exacerbating factors.  She did not have any indoor pets. No history of similar rash.  Past Medical History  Diagnosis Date  . COLONIC POLYPS 05/29/2009  . HYPERLIPIDEMIA 02/29/2008  . GERD 05/29/2009  . OVERACTIVE BLADDER 02/29/2008  . MENOPAUSE, SURGICAL 02/29/2008  . Lumbago 03/12/2010  . Arthritis   . IBS (irritable bowel syndrome)   . Sciatic nerve pain     right  . Ruptured lumbar disc    Past Surgical History  Procedure Laterality Date  . Cholecystectomy  1984  . Kidney surgery  1980    to rotate kidney  . Colon surgery  2006    polyp removed  . Appendectomy  1973  . Ovarian cyst removal  2009  . Cesarean section      x 2  . Abdominal hysterectomy  1976    fiboids    reports that she quit smoking about 44 years ago. Her smoking use included Cigarettes. She smoked 0.00 packs per day. She has never used smokeless tobacco. She reports that she does not drink alcohol or use illicit drugs. family history includes Cerebral aneurysm (age of onset: 42) in her brother; Deep vein thrombosis in her daughter; Hyperlipidemia in her sister; Pancreatic cancer in her mother; Stroke in an other family member. There is no history of Colon cancer or Esophageal cancer. Allergies  Allergen Reactions  . Ciprofloxacin     Upset Gi  . Codeine Nausea Only  . Sulfonamide Derivatives Nausea And Vomiting  . Penicillins Rash       Review of Systems  Constitutional: Negative for fever and chills.  Skin: Positive for rash.  Hematological: Negative for adenopathy.       Objective:   Physical Exam  Constitutional: She appears well-developed and well-nourished.  Cardiovascular: Normal rate and regular rhythm.   Pulmonary/Chest: Effort normal and breath sounds normal. No respiratory distress. She has no wheezes. She has no rales.  Skin: Rash noted.  Patient has a few scattered nonspecific follicular lesions legs bilaterally. No pustules. She has erythematous papules which are nontender to palpation. No vesicles          Assessment & Plan:  Persistent follicular rash lower extremities. Dermatology referral. Trial of doxycycline 100 mg twice a day for 10 days. Reviewed possible side effects.

## 2012-12-31 ENCOUNTER — Other Ambulatory Visit (INDEPENDENT_AMBULATORY_CARE_PROVIDER_SITE_OTHER): Payer: Medicare HMO

## 2012-12-31 DIAGNOSIS — Z0000 Encounter for general adult medical examination without abnormal findings: Secondary | ICD-10-CM

## 2012-12-31 LAB — LIPID PANEL
HDL: 62.3 mg/dL (ref >39.00)
LDL Cholesterol: 91 mg/dL (ref 0–99)
Total CHOL/HDL Ratio: 3
VLDL: 13.4 mg/dL (ref 0.0–40.0)

## 2012-12-31 LAB — CBC WITH DIFFERENTIAL/PLATELET
Basophils Relative: 0.3 % (ref 0.0–3.0)
Eosinophils Relative: 2.8 % (ref 0.0–5.0)
HCT: 41.9 % (ref 36.0–46.0)
Hemoglobin: 13.6 g/dL (ref 12.0–15.0)
Lymphocytes Relative: 33.6 % (ref 12.0–46.0)
Lymphs Abs: 3.1 10*3/uL (ref 0.7–4.0)
Monocytes Relative: 5.7 % (ref 3.0–12.0)
Neutro Abs: 5.4 10*3/uL (ref 1.4–7.7)
RBC: 4.86 Mil/uL (ref 3.87–5.11)
RDW: 14.2 % (ref 11.5–14.6)

## 2012-12-31 LAB — POCT URINALYSIS DIPSTICK
Ketones, UA: NEGATIVE
Leukocytes, UA: NEGATIVE
Protein, UA: NEGATIVE
Urobilinogen, UA: 2
pH, UA: 7

## 2012-12-31 LAB — HEPATIC FUNCTION PANEL
Albumin: 3.3 g/dL — ABNORMAL LOW (ref 3.5–5.2)
Alkaline Phosphatase: 49 U/L (ref 39–117)
Total Protein: 6.7 g/dL (ref 6.0–8.3)

## 2012-12-31 LAB — BASIC METABOLIC PANEL
Calcium: 8.7 mg/dL (ref 8.4–10.5)
GFR: 121.06 mL/min (ref >60.00)
Glucose, Bld: 75 mg/dL (ref 70–99)
Potassium: 3.5 mEq/L (ref 3.5–5.1)
Sodium: 140 mEq/L (ref 135–145)

## 2012-12-31 LAB — TSH: TSH: 0.49 u[IU]/mL (ref 0.35–5.50)

## 2013-01-04 ENCOUNTER — Ambulatory Visit (INDEPENDENT_AMBULATORY_CARE_PROVIDER_SITE_OTHER): Payer: Medicare HMO | Admitting: Family Medicine

## 2013-01-04 ENCOUNTER — Encounter: Payer: Self-pay | Admitting: Family Medicine

## 2013-01-04 VITALS — BP 130/80 | HR 70 | Temp 97.9°F | Wt 181.0 lb

## 2013-01-04 DIAGNOSIS — Z0000 Encounter for general adult medical examination without abnormal findings: Secondary | ICD-10-CM

## 2013-01-04 DIAGNOSIS — R319 Hematuria, unspecified: Secondary | ICD-10-CM

## 2013-01-04 DIAGNOSIS — E8941 Symptomatic postprocedural ovarian failure: Secondary | ICD-10-CM

## 2013-01-04 DIAGNOSIS — E785 Hyperlipidemia, unspecified: Secondary | ICD-10-CM

## 2013-01-04 DIAGNOSIS — I10 Essential (primary) hypertension: Secondary | ICD-10-CM

## 2013-01-04 DIAGNOSIS — K219 Gastro-esophageal reflux disease without esophagitis: Secondary | ICD-10-CM

## 2013-01-04 DIAGNOSIS — Z23 Encounter for immunization: Secondary | ICD-10-CM

## 2013-01-04 DIAGNOSIS — E669 Obesity, unspecified: Secondary | ICD-10-CM

## 2013-01-04 LAB — POCT URINALYSIS DIPSTICK
Bilirubin, UA: NEGATIVE
Glucose, UA: NEGATIVE
Ketones, UA: NEGATIVE
Spec Grav, UA: 1.02
Urobilinogen, UA: 0.2

## 2013-01-04 MED ORDER — ESTRADIOL 1 MG PO TABS: 1.0000 mg | ORAL_TABLET | Freq: Every day | ORAL | Status: DC

## 2013-01-04 MED ORDER — LOSARTAN POTASSIUM 50 MG PO TABS: 50.0000 mg | ORAL_TABLET | Freq: Every day | ORAL | Status: DC

## 2013-01-04 MED ORDER — FLUTICASONE PROPIONATE 50 MCG/ACT NA SUSP: 2.0000 | Freq: Every day | NASAL | Status: DC

## 2013-01-04 MED ORDER — PANTOPRAZOLE SODIUM 40 MG PO TBEC: 40.0000 mg | DELAYED_RELEASE_TABLET | Freq: Every day | ORAL | Status: DC

## 2013-01-04 NOTE — Progress Notes (Signed)
Subjective:    Patient ID: Alison Garza, female    DOB: 1945/10/16, 67 y.o.   MRN: [id number]  HPI  Patient here for well visit and medical followup Her chronic problems include history of obesity, hypertension, hyperlipidemia, GERD, urinary urgency. Medications reviewed. She needs refills of several including losartan, and estradiol, and atorvastatin. Compliant with medications and denies any side effects. No recent chest pains. No recent falls.  No history of shingles vaccine. Needs flu vaccine. Other immunizations up-to-date. Patient is nonsmoker. She's had some recent dry cough and some postnasal drip symptoms. She's tried Claritin and Benadryl without much relief. Still some postnasal drip at night. No associated fever. She has history of GERD which is stable with proton pump inhibitor  Past Medical History  Diagnosis Date  . COLONIC POLYPS 05/29/2009  . HYPERLIPIDEMIA 02/29/2008  . GERD 05/29/2009  . OVERACTIVE BLADDER 02/29/2008  . MENOPAUSE, SURGICAL 02/29/2008  . Lumbago 03/12/2010  . Arthritis   . IBS (irritable bowel syndrome)   . Sciatic nerve pain     right  . Ruptured lumbar disc    Past Surgical History  Procedure Laterality Date  . Cholecystectomy  1984  . Kidney surgery  1980    to rotate kidney  . Colon surgery  2006    polyp removed  . Appendectomy  1973  . Ovarian cyst removal  2009  . Cesarean section      x 2  . Abdominal hysterectomy  1976    fiboids    reports that she quit smoking about 44 years ago. Her smoking use included Cigarettes. She smoked 0.00 packs per day. She has never used smokeless tobacco. She reports that she does not drink alcohol or use illicit drugs. family history includes Cerebral aneurysm (age of onset: 61) in her brother; Deep vein thrombosis in her daughter; Hyperlipidemia in her sister; Pancreatic cancer in her mother; Stroke in an other family member. There is no history of Colon cancer or Esophageal cancer. Allergies   Allergen Reactions  . Ciprofloxacin     Upset Gi  . Codeine Nausea Only  . Sulfonamide Derivatives Nausea And Vomiting  . Penicillins Rash   1.  Risk factors based on Past Medical , Social, and Family history reviewed and as indicated above 2.  Limitations in physical activities no falls 3.  Depression/mood mood is stable 4.  Hearing no hearing deficits 5.  ADLs independent in all 6.  Cognitive function (orientation to time and place, language, writing, speech,memory) no memory deficits. Language and judgment intact  7.  Home Safety no issues identified 8.  Height, weight, and visual acuity. All stable 9.  Counseling discussed the importance of weight loss 10. Recommendation of preventive services. Vaccination. Discussed shingles vaccine in she's not interested 11. Labs based on risk factors recent labs including lipids and chemistries reviewed with patient 12. Care Plan as above   Review of Systems  Constitutional: Negative for fever, activity change, appetite change, fatigue and unexpected weight change.  HENT: Positive for postnasal drip. Negative for ear pain, hearing loss, sore throat and trouble swallowing.   Eyes: Negative for visual disturbance.  Respiratory: Positive for cough. Negative for shortness of breath.   Cardiovascular: Negative for chest pain and palpitations.  Gastrointestinal: Negative for abdominal pain, diarrhea, constipation and blood in stool.  Endocrine: Negative for polydipsia and polyuria.  Genitourinary: Negative for dysuria and hematuria.  Musculoskeletal: Negative for arthralgias, back pain and myalgias.  Skin: Negative for rash.  Neurological:  Negative for dizziness, syncope and headaches.  Hematological: Negative for adenopathy.  Psychiatric/Behavioral: Negative for confusion and dysphoric mood.       Objective:   Physical Exam  Constitutional: She is oriented to person, place, and time. She appears well-developed and well-nourished.  HENT:   Head: Normocephalic and atraumatic.  Eyes: EOM are normal. Pupils are equal, round, and reactive to light.  Neck: Normal range of motion. Neck supple. No thyromegaly present.  Cardiovascular: Normal rate, regular rhythm and normal heart sounds.   No murmur heard. Pulmonary/Chest: Breath sounds normal. No respiratory distress. She has no wheezes. She has no rales.  Abdominal: Soft. Bowel sounds are normal. She exhibits no distension and no mass. There is no tenderness. There is no rebound and no guarding.  Musculoskeletal: Normal range of motion. She exhibits no edema.  Lymphadenopathy:    She has no cervical adenopathy.  Neurological: She is alert and oriented to person, place, and time. She displays normal reflexes. No cranial nerve deficit.  Skin: No rash noted.  Psychiatric: She has a normal mood and affect. Her behavior is normal. Judgment and thought content normal.          Assessment & Plan:  #1 health maintenance. Flu vaccine given. Shingles vaccine declined. Labs reviewed with patient. Repeat urine with trace blood on initial urine dipstick. If positive, send for urine microscopy. No Pap smears as she's had previous hysterectomy. Colonoscopy up to date. Continue yearly mammogram #2 hypertension. Stable. Refilled medication for one year #3 hyperlipidemia. Well controlled with Lipitor #4 post menopause. Discussed pros and cons of estrogen therapy and she is reluctant to discontinue because of side effects of hot flashes after stopping previously #5 GERD - controlled with pantoprazole. She's had difficulty tapering off the past #6 cough related to postnasal drip. Flonase nasal 2 sprays per nostril once daily

## 2013-01-04 NOTE — Patient Instructions (Signed)
Continue with yearly mammogram Continue yearly flu vaccine

## 2013-02-09 ENCOUNTER — Other Ambulatory Visit: Payer: Self-pay | Admitting: Family Medicine

## 2013-02-09 ENCOUNTER — Encounter: Payer: Self-pay | Admitting: Family Medicine

## 2013-02-09 ENCOUNTER — Ambulatory Visit (INDEPENDENT_AMBULATORY_CARE_PROVIDER_SITE_OTHER)
Admission: RE | Admit: 2013-02-09 | Discharge: 2013-02-09 | Disposition: A | Discharge status: Home / Self Care | Payer: Medicare HMO | Source: Ambulatory Visit | Attending: Family Medicine | Admitting: Family Medicine

## 2013-02-09 ENCOUNTER — Ambulatory Visit (INDEPENDENT_AMBULATORY_CARE_PROVIDER_SITE_OTHER): Payer: Medicare HMO | Admitting: Family Medicine

## 2013-02-09 VITALS — BP 130/80 | HR 82 | Temp 98.1°F | Wt 183.0 lb

## 2013-02-09 DIAGNOSIS — R300 Dysuria: Secondary | ICD-10-CM

## 2013-02-09 DIAGNOSIS — R109 Unspecified abdominal pain: Secondary | ICD-10-CM

## 2013-02-09 DIAGNOSIS — M25562 Pain in left knee: Secondary | ICD-10-CM

## 2013-02-09 DIAGNOSIS — M25569 Pain in unspecified knee: Secondary | ICD-10-CM

## 2013-02-09 LAB — POCT URINALYSIS DIPSTICK
Bilirubin, UA: NEGATIVE
Glucose, UA: NEGATIVE
Ketones, UA: NEGATIVE
Leukocytes, UA: NEGATIVE
Protein, UA: NEGATIVE

## 2013-02-09 LAB — URINALYSIS, MICROSCOPIC ONLY: RBC / HPF: NONE SEEN (ref 0–?)

## 2013-02-09 NOTE — Progress Notes (Signed)
Pre visit review using our clinic review tool, if applicable. No additional management support is needed unless otherwise documented below in the visit note. 

## 2013-02-09 NOTE — Progress Notes (Signed)
Subjective:    Patient ID: Alison Garza, female    DOB: [birth year], 67 y.o.   MRN: [id number]  HPI Patient here with right flank pain. Duration for approximately one week. History of similar pain in the past. She also complains of some nonspecific urinary symptoms with urgency, occasional burning, and frequency. No gross hematuria. No fevers or chills. No nausea or vomiting. She has seen urologist previously apparently for some type of benign cyst on kidney she's also had prior history of cystocele surgery. Regarding her current pain, pain is dull and relatively constant. No exacerbating or alleviating factors. No significant radiation. No change in bowel habits. No gross hematuria  Second issue is progressive left knee pain. She has daily pain and intermittent stiffness. No recent injury. Has been injected previously with corticosteroids which did help significantly. She's never had any x-rays. Location is mostly medial compartment of left knee. No effusion. No warmth. No erythema.  Past Medical History  Diagnosis Date  . COLONIC POLYPS 05/29/2009  . HYPERLIPIDEMIA 02/29/2008  . GERD 05/29/2009  . OVERACTIVE BLADDER 02/29/2008  . MENOPAUSE, SURGICAL 02/29/2008  . Lumbago 03/12/2010  . Arthritis   . IBS (irritable bowel syndrome)   . Sciatic nerve pain     right  . Ruptured lumbar disc    Past Surgical History  Procedure Laterality Date  . Cholecystectomy  1984  . Kidney surgery  1980    to rotate kidney  . Colon surgery  2006    polyp removed  . Appendectomy  1973  . Ovarian cyst removal  2009  . Cesarean section      x 2  . Abdominal hysterectomy  1976    fiboids    reports that she quit smoking about 44 years ago. Her smoking use included Cigarettes. She smoked 0.00 packs per day. She has never used smokeless tobacco. She reports that she does not drink alcohol or use illicit drugs. family history includes Cerebral aneurysm (age of onset: 61) in her brother; Deep vein  thrombosis in her daughter; Hyperlipidemia in her sister; Pancreatic cancer in her mother; Stroke in an other family member. There is no history of Colon cancer or Esophageal cancer. Allergies  Allergen Reactions  . Ciprofloxacin     Upset Gi  . Codeine Nausea Only  . Sulfonamide Derivatives Nausea And Vomiting  . Penicillins Rash      Review of Systems  Constitutional: Positive for chills. Negative for fever, appetite change and unexpected weight change.  Respiratory: Negative for cough and shortness of breath.   Cardiovascular: Negative for chest pain.  Gastrointestinal: Negative for abdominal pain.  Genitourinary: Positive for dysuria.  Musculoskeletal: Positive for arthralgias.       Objective:   Physical Exam  Constitutional: She appears well-developed and well-nourished.  Cardiovascular: Normal rate and regular rhythm.   Pulmonary/Chest: Effort normal and breath sounds normal.  Abdominal: Soft. Bowel sounds are normal. She exhibits no distension. There is no tenderness. There is no rebound.  Musculoskeletal:  Left knee reveals full range of motion. No effusion. No warmth. No erythema. She has mild to moderate medial joint space tenderness.          Assessment & Plan:  #1 left knee pain. ? Osteoarthritis-though she has no significant crepitus and fluid ROM. Obtain x-rays as a baseline. We discussed pros and cons of corticosteroid injection we'll consider if her pain persists is confirmed osteoarthritis is suspected. We discussed the importance of regular exercises, especially for quadriceps  strengthening and also the importance of weight loss #2 right flank pain. She has nonspecific urine dipstick of trace blood but she's had in the past. Nitrites and leukocytes negative. Send urine Micro. Suspect more likely musculoskeletal cause of pain.

## 2013-02-11 ENCOUNTER — Telehealth: Payer: Self-pay | Admitting: Family Medicine

## 2013-02-11 NOTE — Telephone Encounter (Signed)
Pt informed

## 2013-02-11 NOTE — Telephone Encounter (Signed)
Urine cx was NOT done since there was no evidence for infection.  We did send urine Micro to rule out red cells and this was negative. If she is still having urinary symptoms, I suggest she follow up with her urologist.

## 2013-02-11 NOTE — Telephone Encounter (Signed)
Pt would like results of urine culture. Pt still having issues and would like to know asap.

## 2013-02-11 NOTE — Telephone Encounter (Signed)
Pt was informed that as soon as i received the results i would give her a call

## 2013-02-23 ENCOUNTER — Emergency Department (HOSPITAL_BASED_OUTPATIENT_CLINIC_OR_DEPARTMENT_OTHER)
Admission: EM | Admit: 2013-02-23 | Discharge: 2013-02-23 | Disposition: A | Discharge status: Home / Self Care | Payer: Medicare HMO | Attending: Emergency Medicine | Admitting: Emergency Medicine

## 2013-02-23 ENCOUNTER — Telehealth: Payer: Self-pay | Admitting: Family Medicine

## 2013-02-23 ENCOUNTER — Other Ambulatory Visit (HOSPITAL_BASED_OUTPATIENT_CLINIC_OR_DEPARTMENT_OTHER): Payer: Medicare HMO

## 2013-02-23 ENCOUNTER — Encounter (HOSPITAL_BASED_OUTPATIENT_CLINIC_OR_DEPARTMENT_OTHER): Payer: Self-pay | Admitting: Emergency Medicine

## 2013-02-23 ENCOUNTER — Emergency Department (HOSPITAL_BASED_OUTPATIENT_CLINIC_OR_DEPARTMENT_OTHER): Payer: Medicare HMO

## 2013-02-23 DIAGNOSIS — M7989 Other specified soft tissue disorders: Secondary | ICD-10-CM | POA: Insufficient documentation

## 2013-02-23 DIAGNOSIS — Z87448 Personal history of other diseases of urinary system: Secondary | ICD-10-CM | POA: Insufficient documentation

## 2013-02-23 DIAGNOSIS — Z79899 Other long term (current) drug therapy: Secondary | ICD-10-CM | POA: Insufficient documentation

## 2013-02-23 DIAGNOSIS — Z8739 Personal history of other diseases of the musculoskeletal system and connective tissue: Secondary | ICD-10-CM | POA: Insufficient documentation

## 2013-02-23 DIAGNOSIS — Z87891 Personal history of nicotine dependence: Secondary | ICD-10-CM | POA: Insufficient documentation

## 2013-02-23 DIAGNOSIS — E785 Hyperlipidemia, unspecified: Secondary | ICD-10-CM | POA: Insufficient documentation

## 2013-02-23 DIAGNOSIS — Z860100 Personal history of colon polyps, unspecified: Secondary | ICD-10-CM | POA: Insufficient documentation

## 2013-02-23 DIAGNOSIS — Z880 Allergy status to penicillin: Secondary | ICD-10-CM | POA: Insufficient documentation

## 2013-02-23 DIAGNOSIS — M79605 Pain in left leg: Secondary | ICD-10-CM | POA: Diagnosis present

## 2013-02-23 DIAGNOSIS — K219 Gastro-esophageal reflux disease without esophagitis: Secondary | ICD-10-CM | POA: Insufficient documentation

## 2013-02-23 DIAGNOSIS — IMO0002 Reserved for concepts with insufficient information to code with codable children: Secondary | ICD-10-CM | POA: Insufficient documentation

## 2013-02-23 DIAGNOSIS — M79609 Pain in unspecified limb: Secondary | ICD-10-CM | POA: Insufficient documentation

## 2013-02-23 DIAGNOSIS — Z8742 Personal history of other diseases of the female genital tract: Secondary | ICD-10-CM | POA: Insufficient documentation

## 2013-02-23 MED ORDER — OXYCODONE-ACETAMINOPHEN 5-325 MG PO TABS: 1.0000 | ORAL_TABLET | Freq: Four times a day (QID) | ORAL | Status: DC | PRN

## 2013-02-23 NOTE — ED Notes (Signed)
Patient transported to doppler ambulatory with tech.

## 2013-02-23 NOTE — ED Notes (Signed)
Pt reports pain in left knee area.  Has had chronic pain in same previously.  Reports blood vessels visible in same have 'popped out'.  Went to pcp and had xray which was negative.  Report cortisone shot in leg relieves pain typically.  No new injury.

## 2013-02-23 NOTE — ED Notes (Signed)
Radiology called - order changed as requested.

## 2013-02-23 NOTE — ED Notes (Signed)
MD at bedside. 

## 2013-02-23 NOTE — ED Provider Notes (Signed)
CSN: [id number]     Arrival date & time 02/23/13  1535 History   First MD Initiated Contact with Patient 02/23/13 1719     Chief Complaint  Patient presents with  . Claudication   (Consider location/radiation/quality/duration/timing/severity/associated sxs/prior Treatment) Patient is a 67 y.o. female presenting with leg pain. The history is provided by the patient.  Leg Pain Location:  Leg Time since incident:  2 weeks Injury: no   Leg location:  L leg Pain details:    Quality:  Aching   Radiates to:  Does not radiate   Severity:  Mild   Onset quality:  Gradual   Duration:  2 weeks   Timing:  Constant   Progression:  Unchanged Chronicity:  New Dislocation: no   Foreign body present:  No foreign bodies Prior injury to area:  No Relieved by:  Nothing Worsened by:  Nothing tried Ineffective treatments: nsaids. Associated symptoms: swelling (mild)   Associated symptoms: no back pain, no fatigue, no fever, no neck pain and no numbness     Past Medical History  Diagnosis Date  . COLONIC POLYPS 05/29/2009  . HYPERLIPIDEMIA 02/29/2008  . GERD 05/29/2009  . OVERACTIVE BLADDER 02/29/2008  . MENOPAUSE, SURGICAL 02/29/2008  . Lumbago 03/12/2010  . Arthritis   . IBS (irritable bowel syndrome)   . Sciatic nerve pain     right  . Ruptured lumbar disc    Past Surgical History  Procedure Laterality Date  . Cholecystectomy  1984  . Kidney surgery  1980    to rotate kidney  . Colon surgery  2006    polyp removed  . Appendectomy  1973  . Ovarian cyst removal  2009  . Cesarean section      x 2  . Abdominal hysterectomy  1976    fiboids   Family History  Problem Relation Age of Onset  . Pancreatic cancer Mother   . Hyperlipidemia Sister   . Cerebral aneurysm Brother 60  . Deep vein thrombosis Daughter   . Stroke      maternal family  . Colon cancer Neg Hx   . Esophageal cancer Neg Hx    History  Substance Use Topics  . Smoking status: Former Smoker    Types:  Cigarettes    Quit date: 03/04/1968  . Smokeless tobacco: Never Used  . Alcohol Use: No   OB History   Grav Para Term Preterm Abortions TAB SAB Ect Mult Living                 Review of Systems  Constitutional: Negative for fever and fatigue.  HENT: Negative for congestion and drooling.   Eyes: Negative for pain.  Respiratory: Negative for cough and shortness of breath.   Cardiovascular: Negative for chest pain.  Gastrointestinal: Negative for nausea, vomiting, abdominal pain and diarrhea.  Genitourinary: Negative for dysuria and hematuria.  Musculoskeletal: Negative for back pain, gait problem and neck pain.  Skin: Negative for color change.  Neurological: Negative for dizziness and headaches.  Hematological: Negative for adenopathy.  Psychiatric/Behavioral: Negative for behavioral problems.  All other systems reviewed and are negative.    Allergies  Ciprofloxacin; Codeine; Sulfonamide derivatives; and Penicillins  Home Medications   Current Outpatient Rx  Name  Route  Sig  Dispense  Refill  . atorvastatin (LIPITOR) 10 MG tablet   Oral   Take 1 tablet (10 mg total) by mouth daily.   90 tablet   3   . carboxymethylcellulose (REFRESH PLUS) 0.5 %  SOLN      1 drop daily as needed.         Marland Kitchen estradiol (ESTRACE) 1 MG tablet   Oral   Take 1 tablet (1 mg total) by mouth daily.   90 tablet   3   . fluticasone (FLONASE) 50 MCG/ACT nasal spray   Nasal   Place 2 sprays into the nose daily.   16 g   6   . losartan (COZAAR) 50 MG tablet   Oral   Take 1 tablet (50 mg total) by mouth daily.   90 tablet   3   . Multiple Vitamin (MULITIVITAMIN WITH MINERALS) TABS   Oral   Take 1 tablet by mouth daily.           Marland Kitchen oxybutynin (DITROPAN-XL) 5 MG 24 hr tablet   Oral   Take 1 tablet (5 mg total) by mouth daily as needed. For bladder spasms   30 tablet   11   . pantoprazole (PROTONIX) 40 MG tablet   Oral   Take 1 tablet (40 mg total) by mouth daily.   90  tablet   3   . triamcinolone cream (KENALOG) 0.1 %   Topical   Apply topically 2 (two) times daily. For 1 week   30 g   0    BP 147/85  Pulse 101  Temp(Src) 98 F (36.7 C) (Oral)  Resp 20  Ht 4\' 10"  (1.473 m)  Wt 183 lb (83.008 kg)  BMI 38.26 kg/m2  SpO2 99% Physical Exam  Nursing note and vitals reviewed. Constitutional: She is oriented to person, place, and time. She appears well-developed and well-nourished.  HENT:  Head: Normocephalic.  Mouth/Throat: Oropharynx is clear and moist. No oropharyngeal exudate.  Eyes: Conjunctivae and EOM are normal. Pupils are equal, round, and reactive to light.  Neck: Normal range of motion. Neck supple.  Cardiovascular: Normal rate, regular rhythm, normal heart sounds and intact distal pulses.  Exam reveals no gallop and no friction rub.   No murmur heard. Pulmonary/Chest: Effort normal and breath sounds normal. No respiratory distress. She has no wheezes.  Abdominal: Soft. Bowel sounds are normal. There is no tenderness. There is no rebound and no guarding.  Musculoskeletal: Normal range of motion. She exhibits no edema.  No obvious asymmetry noted in the lower extremities.  2+ distal pulses in the lower extremities.  Mild left medial thigh and anterior knee tenderness to palpation.  Normal range of motion of the left lower extremity.  Neurological: She is alert and oriented to person, place, and time.  Skin: Skin is warm and dry.  Psychiatric: She has a normal mood and affect. Her behavior is normal.    ED Course  Procedures (including critical care time) Labs Review Labs Reviewed - No data to display Imaging Review US Venous Img Lower Unilateral Left  02/23/2013   CLINICAL DATA:  Left leg pain and swelling.  Deep venous thrombosis.  EXAM: Left LOWER EXTREMITY VENOUS DOPPLER ULTRASOUND  TECHNIQUE: Gray-scale sonography with graded compression, as well as color Doppler and duplex ultrasound, were performed to evaluate the deep  venous system from the level of the common femoral vein through the popliteal and proximal calf veins. Spectral Doppler was utilized to evaluate flow at rest and with distal augmentation maneuvers.  COMPARISON:  None.  FINDINGS: Thrombus within deep veins:  None visualized.  Compressibility of deep veins:  Normal.  Duplex waveform respiratory phasicity:  Normal.  Duplex waveform response to augmentation:  Normal.  Venous reflux:  None visualized.  Other findings:  None visualized.  IMPRESSION: Negative left lower extremity DVT study.   Electronically Signed   By: Andreas Newport M.D.   On: 02/23/2013 18:55    EKG Interpretation   None       MDM   1. Left leg pain    5:35 PM 67 y.o. female who presents with left medial thigh pain and knee pain which began approximately 2 weeks ago. She denies any injury and states that she has had negative plain film imaging by her primary care physician. She is concerned about a blood clot. Will get [place] to r/o DVT.   7:39 PM: I interpreted/reviewed the labs and/or imaging which were non-contributory.   I have discussed the diagnosis/risks/treatment options with the patient and believe the pt to be eligible for discharge home to follow-up with pcp in 1 week. We also discussed returning to the ED immediately if new or worsening sx occur. We discussed the sx which are most concerning (e.g., worsening pain, fever) that necessitate immediate return. Any new prescriptions provided to the patient are listed below.  Discharge Medication List as of 02/23/2013  7:40 PM    START taking these medications   Details  oxyCODONE-acetaminophen (PERCOCET) 5-325 MG per tablet Take 1 tablet by mouth every 6 (six) hours as needed for moderate pain., Starting 02/23/2013, Until Discontinued, Print         Junius Argyle, MD 02/24/13 1134

## 2013-02-23 NOTE — ED Notes (Signed)
Pt verbalizing upset b/c she is in hallway bed.  Informed that there were 5 people in front of her when placed in this bed and placed her in this bed to expedite care d/t high patient census at this time.  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  Apologized for positioning, reiterated that both patient census and acuity is high and she was placed in bed to get her seen quicker instead of having to wait approx 1-2 hrs to be evaluated.

## 2013-02-23 NOTE — Telephone Encounter (Signed)
Patient Information:  Caller Name: Leonda  Phone: [phone]  Patient: Alison Garza, Alison Garza  Gender: Female  DOB: May 09, 1945  Age: 67 Years  PCP: Evelena Peat (Family Practice)  Office Follow Up:  Does the office need to follow up with this patient?: Yes  Instructions For The Office: requesting call back regarding MRI referral  RN Note:  Reports 3 small broken blood vessels in thigh and swelling of knee. Leg pain rated 10/10. Edema is the same as when examined.  No appointments remain in office 02/23/13. Referred to [facility].  Reported will go to [facility] to be evaluated.  Please call back to advise if MD will order an MRI.  Symptoms  Reason For Call & Symptoms: Emergent Call:  Left inner thigh and knee pain for past 2 weeks.  As[facility] for [facility] to be scheduled for further evaluation.  Reviewed Health History In EMR: Yes  Reviewed Medications In EMR: Yes  Reviewed Allergies In EMR: Yes  Reviewed Surgeries / Procedures: Yes  Date of Onset of Symptoms: 02/09/2013  Treatments Tried: Aleve, rest, elevation, Biofreeze.  Treatments Tried Worked: No  Guideline(s) Used:  Leg Pain  Disposition Per Guideline:   Go to ED Now (or to Office with PCP Approval)  Reason For Disposition Reached:   Thigh or calf pain in only one leg and present > 1 hour  Advice Given:  N/A  RN Overrode Recommendation:  Go To [facility]  No appointments remain for 02/23/13.

## 2013-03-17 ENCOUNTER — Ambulatory Visit (INDEPENDENT_AMBULATORY_CARE_PROVIDER_SITE_OTHER): Payer: Commercial Managed Care - HMO | Admitting: Family Medicine

## 2013-03-17 ENCOUNTER — Encounter: Payer: Self-pay | Admitting: Family Medicine

## 2013-03-17 VITALS — BP 130/70 | HR 79 | Temp 98.0°F | Wt 184.0 lb

## 2013-03-17 DIAGNOSIS — M25562 Pain in left knee: Secondary | ICD-10-CM

## 2013-03-17 DIAGNOSIS — M25569 Pain in unspecified knee: Secondary | ICD-10-CM

## 2013-03-17 DIAGNOSIS — R600 Localized edema: Secondary | ICD-10-CM

## 2013-03-17 DIAGNOSIS — R609 Edema, unspecified: Secondary | ICD-10-CM

## 2013-03-17 MED ORDER — FUROSEMIDE 20 MG PO TABS: ORAL_TABLET | ORAL | Status: DC

## 2013-03-17 NOTE — Progress Notes (Signed)
Pre visit review using our clinic review tool, if applicable. No additional management support is needed unless otherwise documented below in the visit note. 

## 2013-03-17 NOTE — Patient Instructions (Signed)
Potassium Content of Foods Potassium is a mineral found in many foods and drinks. It helps keep fluids and minerals balanced in your body and also affects how steadily your heart beats. The body needs potassium to control blood pressure and to keep the muscles and nervous system healthy. However, certain health conditions and medicine may require you to eat more or less potassium-rich foods and drinks. Your caregiver or dietitian will tell you how much potassium you should have each day. COMMON SERVING SIZES The list below tells you how big or small common portion sizes are:  1 oz.........4 stacked dice.  3 oz.........Deck of cards.  1 tsp........Tip of little finger.  1 tbsp......Thumb.  2 tbsp......Golf ball.   c...........Half of a fist.  1 c............A fist. FOODS AND DRINKS HIGH IN POTASSIUM More than 200 mg of potassium per serving. A serving size is  c (120 mL or noted gram weight) unless otherwise stated. While all the items on this list are high in potassium, some items are higher in potassium than others. Fruits  Apricots (sliced), 83 g.  Apricots (dried halves), 3 oz / 24 g.  Avocado (cubed),  c / 50 g.  Banana (sliced), 75 g.  Cantaloupe (cubed), 80 g.  Dates (pitted), 5 whole / 35 g.  Figs (dried), 4 whole / 32 g.  Guava, c / 55 g.  Honeydew, 1 wedge / 85 g.  Kiwi (sliced), 90 g.  Nectarine, 1 small / 129 g.  Orange, 1 medium / 131 g.  Orange juice.  Pomegranate seeds, 87 g.  Pomegranate juice.  Prunes (pitted), 3 whole / 30 g.  Prune juice, 3 oz / 90 mL.  Seedless raisins, 3 tbsp / 27 g. Vegetables  Artichoke,  of a medium / 64 g.  Asparagus (boiled), 90 g.  Baked beans,  c / 63 g.  Bamboo shoots,  c / 38 g.  Beets (cooked slices), 85 g.  Broccoli (boiled), 78 g.  Brussels sprout (boiled), 78 g.  Butternut squash (baked), 103 g.  Chickpea (cooked), 82 g.  Green peas (cooked), 80 g.  Hubbard squash (baked cubes),  c /  68 g.  Kidney beans (cooked), 5 tbsp / 55 g.  Lima beans (cooked),  c / 43 g.  Navy beans (cooked),  c / 61 g.  Potato (baked), 61 g.  Potato (boiled), 78 g.  Pumpkin (boiled), 123 g.  Refried beans,  c / 79 g.  Spinach (cooked),  c / 45 g.  Split peas (cooked),  c / 65 g.  Sun-dried tomatoes, 2 tbsp / 7 g.  Sweet potato (baked),  c / 50 g.  Tomato (chopped or sliced), 90 g.  Tomato juice.  Tomato paste, 4 tsp / 21 g.  Tomato sauce,  c / 61 g.  Vegetable juice.  White mushrooms (cooked), 78 g.  Yam (cooked or baked),  c / 34 g.  Zucchini squash (boiled), 90 g. Other Foods and Drinks  Almonds (whole),  c / 36 g.  Cashews (oil roasted),  c / 32 g.  Chocolate milk.  Chocolate pudding, 142 g.  Clams (steamed), 1.5 oz / 43 g.  Dark chocolate, 1.5 oz / 42 g.  Fish, 3 oz / 85 g.  King crab (steamed), 3 oz / 85 g.  Lobster (steamed), 4 oz / 113 g.  Milk (skim, 1%, 2%, whole), 1 c / 240 mL.  Milk chocolate, 2.3 oz / 66 g.  Milk shake.  Nonfat fruit   variety yogurt, 123 g.  Peanuts (oil roasted), 1 oz / 28 g.  Peanut butter, 2 tbsp / 32 g.  Pistachio nuts, 1 oz / 28 g.  Pumpkin seeds, 1 oz / 28 g.  Red meat (broiled, cooked, grilled), 3 oz / 85 g.  Scallops (steamed), 3 oz / 85 g.  Shredded wheat cereal (dry), 3 oblong biscuits / 75 g.  Spaghetti sauce,  c / 66 g.  Sunflower seeds (dry roasted), 1 oz / 28 g.  Veggie burger, 1 patty / 70 g. FOODS MODERATE IN POTASSIUM Between 150 mg and 200 mg per serving. A serving is  c (120 mL or noted gram weight) unless otherwise stated. Fruits  Grapefruit,  of the fruit / 123 g.  Grapefruit juice.  Pineapple juice.  Plums (sliced), 83 g.  Tangerine, 1 large / 120 g. Vegetables  Carrots (boiled), 78 g.  Carrots (sliced), 61 g.  Rhubarb (cooked with sugar), 120 g.  Rutabaga (cooked), 120 g.  Sweet corn (cooked), 75 g.  Yellow snap beans (cooked), 63 g. Other Foods and  Drinks   Bagel, 1 bagel / 98 g.  Chicken breast (roasted and chopped),  c / 70 g.  Chocolate ice cream / 66 g.  Pita bread, 1 large / 64 g.  Shrimp (steamed), 4 oz / 113 g.  Swiss cheese (diced), 70 g.  Vanilla ice cream, 66 g.  Vanilla pudding, 140 g. FOODS LOW IN POTASSIUM Less than 150 mg per serving. A serving size is  cup (120 mL or noted gram weight) unless otherwise stated. If you eat more than 1 serving of a food low in potassium, the food may be considered a food high in potassium. Fruits  Apple (slices), 55 g.  Apple juice.  Applesauce, 122 g.  Blackberries, 72 g.  Blueberries, 74 g.  Cranberries, 50 g.  Cranberry juice.  Fruit cocktail, 119 g.  Fruit punch.  Grapes, 46 g.  Grape juice.  Mandarin oranges (canned), 126 g.  Peach (slices), 77 g.  Pineapple (chunks), 83 g.  Raspberries, 62 g.  Red cherries (without pits), 78 g.  Strawberries (sliced), 83 g.  Watermelon (diced), 76 g. Vegetables  Alfalfa sprouts, 17 g.  Bell peppers (sliced), 46 g.  Cabbage (shredded), 35 g.  Cauliflower (boiled), 62 g.  Celery, 51 g.  Collard greens (boiled), 95 g.  Cucumber (sliced), 52 g.  Eggplant (cubed), 41 g.  Green beans (boiled), 63 g.  Lettuce (shredded), 1 c / 36 g.  Onions (sauteed), 44 g.  Radishes (sliced), 58 g.  Spaghetti squash, 51 g. Other Foods and Drinks  Angel food cake, 1 slice / 28 g.  Black tea.  Brown rice (cooked), 98 g.  Butter croissant, 1 medium / 57 g.  Carbonated soda.  Coffee.  Cheddar cheese (diced), 66 g.  Corn flake cereal (dry), 14 g.  Cottage cheese, 118 g.  Cream of rice cereal (cooked), 122 g.  Cream of wheat cereal (cooked), 126 g.  Crisped rice cereal (dry), 14 g.  Egg (boiled, fried, poached, omelet, scrambled), 1 large / 46 61 g.  English muffin, 1 muffin / 57 g.  Frozen ice pop, 1 pop / 55 g.  Graham cracker, 1 large rectangular cracker / 14 g.  Jelly beans, 112  g.  Non-dairy whipped topping.  Oatmeal, 88 g.  Orange sherbet, 74 g.  Puffed rice cereal (dry), 7 g.  Pasta (cooked), 70 g.  Rice cakes, 4 cakes / 36   g.  Sugared doughnut, 4 oz / 116 g.  White bread, 1 slice / 30 g.  White rice (cooked), 79 93 g.  Wild rice (cooked), 82 g.  Yellow cake, 1 slice / 68 g. Document Released: 10/02/2004 Document Revised: 02/05/2012 Document Reviewed: 07/05/2011 [facility] Patient Information 2014 [place].  Touch base in 2 weeks if knee pain not improving

## 2013-03-17 NOTE — Progress Notes (Signed)
Subjective:    Patient ID: Alison Garza, female    DOB: 31-Jan-1946, 68 y.o.   MRN: [id number]  HPI  Patient seen for ER followup. She had some persistent left knee pain. We had obtained plain x-rays which show no significant degenerative changes. Her location is mostly medial aspect of knee. Several months ago last summer she received corticosteroid injection here and her pain was significantly improved for several weeks. She is requesting the same today.  She went to emergency department with somewhat poorly localized pain including left medial thigh and medial knee. Doppler revealed no DVT. She has occasional lower extremity edema and is requesting when necessary low-dose diuretic. She takes losartan for hypertension. She denies any locking or giving way of the knee. No warmth. No erythema. No recent injury. Pain is moderate. Unrelieved with over-the-counter medications.  Past Medical History  Diagnosis Date  . COLONIC POLYPS 05/29/2009  . HYPERLIPIDEMIA 02/29/2008  . GERD 05/29/2009  . OVERACTIVE BLADDER 02/29/2008  . MENOPAUSE, SURGICAL 02/29/2008  . Lumbago 03/12/2010  . Arthritis   . IBS (irritable bowel syndrome)   . Sciatic nerve pain     right  . Ruptured lumbar disc    Past Surgical History  Procedure Laterality Date  . Cholecystectomy  1984  . Kidney surgery  1980    to rotate kidney  . Colon surgery  2006    polyp removed  . Appendectomy  1973  . Ovarian cyst removal  2009  . Cesarean section      x 2  . Abdominal hysterectomy  1976    fiboids    reports that she quit smoking about 45 years ago. Her smoking use included Cigarettes. She smoked 0.00 packs per day. She has never used smokeless tobacco. She reports that she does not drink alcohol or use illicit drugs. family history includes Cerebral aneurysm (age of onset: 79) in her brother; Deep vein thrombosis in her daughter; Hyperlipidemia in her sister; Pancreatic cancer in her mother; Stroke in an other  family member. There is no history of Colon cancer or Esophageal cancer. Allergies  Allergen Reactions  . Ciprofloxacin     Upset Gi  . Codeine Nausea Only  . Sulfonamide Derivatives Nausea And Vomiting  . Penicillins Rash       Review of Systems  Constitutional: Negative for fever and chills.  Musculoskeletal: Positive for arthralgias.       Objective:   Physical Exam  Constitutional: She appears well-developed and well-nourished.  Cardiovascular: Normal rate and regular rhythm.   Pulmonary/Chest: Effort normal and breath sounds normal. No respiratory distress. She has no wheezes. She has no rales.  Musculoskeletal:  Left knee reveals no effusion. No warmth. No erythema. No ecchymosis. She has moderate medial joint line tenderness. Ligament testing is normal. Good range of motion. No pitting edema lower extremities          Assessment & Plan:  Left knee pain. Recent x-rays revealed no significant arthritis-or any other acute bony abnormality. Recent Doppler no DVT. Question meniscal though she has no effusion. She has previously gotten good relief for months following corticosteroid injection. She is requesting the same today. Discussed risk and benefits of corticosteroid injection-including risk of bleeding, bruising, infection. Patient consented. Left knee prepped with Betadine. Using one and one half inch 22-gauge needle 40 mg Depo-Medrol 2 cc plain Xylocaine injected. Patient tolerated well  Mild peripheral edema. Discussed importance of weight loss. Low-dose furosemide 20 mg but use  infrequently  for severe edema. Elevate legs frequent. Watch sodium intake. High potassium diet recommended.

## 2013-06-11 ENCOUNTER — Telehealth: Payer: Self-pay | Admitting: Family Medicine

## 2013-06-11 MED ORDER — ATORVASTATIN CALCIUM 10 MG PO TABS: 10.0000 mg | ORAL_TABLET | Freq: Every day | ORAL | Status: DC

## 2013-06-11 NOTE — Telephone Encounter (Signed)
Pt need re-fill on atorvastatin (LIPITOR) 10 MG tablet. Send to [facility]

## 2013-06-11 NOTE — Telephone Encounter (Signed)
RX sent to [facility]  

## 2013-06-28 ENCOUNTER — Encounter: Payer: Self-pay | Admitting: Family Medicine

## 2013-06-28 ENCOUNTER — Ambulatory Visit (INDEPENDENT_AMBULATORY_CARE_PROVIDER_SITE_OTHER): Payer: Commercial Managed Care - HMO | Admitting: Family Medicine

## 2013-06-28 VITALS — BP 140/80 | HR 87 | Temp 97.5°F | Wt 185.0 lb

## 2013-06-28 DIAGNOSIS — L299 Pruritus, unspecified: Secondary | ICD-10-CM

## 2013-06-28 DIAGNOSIS — R238 Other skin changes: Secondary | ICD-10-CM

## 2013-06-28 DIAGNOSIS — L988 Other specified disorders of the skin and subcutaneous tissue: Secondary | ICD-10-CM

## 2013-06-28 MED ORDER — HYDROXYZINE PAMOATE 25 MG PO CAPS: 25.0000 mg | ORAL_CAPSULE | Freq: Three times a day (TID) | ORAL | Status: DC | PRN

## 2013-06-28 NOTE — Progress Notes (Signed)
Subjective:    Patient ID: Alison Garza, female    DOB: 11/04/1945, 68 y.o.   MRN: [id number]  HPI Comments: Patient is a 68 year old female presenting with complaints of bilateral leg rash and cyst under left breast.  The rash is chronic, localized to both shins and calves, and described as itchy. Patient says she uses Dove soap and Cerave lotion. Patient was given clobetasol 0.05% cream by a dermatologist but states it causes her legs to swell. No recent exposure to new chemicals or detergents. Patient denies household pets, bites, injury, shaving, or contact with anyone with a rash. Patient would like the rash to be scraped for further clarification.  Patient states there is a cyst like growth under medial aspect of left breast that she first noticed Saturday night. He pain is described as sore. She was afraid to try anything to make it better. She has never had anything like this before. Had a normal mammogram in the past year.   Rash This is a chronic problem. The affected locations include the right lower leg and left lower leg. The rash is characterized by burning and itchiness. She was exposed to nothing. There is no history of asthma or eczema.   Past Medical History  Diagnosis Date  . COLONIC POLYPS 05/29/2009  . HYPERLIPIDEMIA 02/29/2008  . GERD 05/29/2009  . OVERACTIVE BLADDER 02/29/2008  . MENOPAUSE, SURGICAL 02/29/2008  . Lumbago 03/12/2010  . Arthritis   . IBS (irritable bowel syndrome)   . Sciatic nerve pain     right  . Ruptured lumbar disc    Past Surgical History  Procedure Laterality Date  . Cholecystectomy  1984  . Kidney surgery  1980    to rotate kidney  . Colon surgery  2006    polyp removed  . Appendectomy  1973  . Ovarian cyst removal  2009  . Cesarean section      x 2  . Abdominal hysterectomy  1976    fiboids      Review of Systems  Skin: Positive for rash.       Objective:   Physical Exam  Constitutional: She appears well-developed and  well-nourished.  HENT:  Head: Normocephalic.  Cardiovascular: Normal rate and normal heart sounds.   Pulses:      Dorsalis pedis pulses are 2+ on the right side, and 2+ on the left side.       Posterior tibial pulses are 2+ on the right side, and 2+ on the left side.  Pulmonary/Chest: Effort normal and breath sounds normal. Left breast exhibits tenderness.    Small indurated and somewhat fluctuant papule localized to medial posterior aspect of left breast.   Musculoskeletal:       Right ankle: She exhibits normal range of motion. No tenderness.       Left ankle: She exhibits normal range of motion. No tenderness.       Right lower leg: She exhibits edema. She exhibits no tenderness, no deformity and no laceration.       Left lower leg: She exhibits edema. She exhibits no tenderness, no bony tenderness, no deformity and no laceration.  Trace edema to lower legs bilaterally   Neurological: She is alert. Coordination normal.  Skin: Skin is warm and dry. No abrasion, no burn, no laceration, no lesion and no rash noted. No erythema.  No hair noted to lower legs bilaterally.       Assessment & Plan:  1. Pruritus Advised continuing use of  Dove soap and Cerave moisturizer. Since topical steroids are not reducing itching, next step is to try oral medication.   2. Indurated Epidermal Papule Warm compresses several times a day to see if it will decrease pain and swelling. Keep clean and wash with soap regularly. Cannot lance it right now. Could be an early abscess or an infected sebaceous cyst. If pain progressing, follow up office visit to drain.   [place], PA-S  Patient has pruritis lower legs with no real rash evident today but mild dryness. She has tried topicals without improvement.  Try low dose Atarax 25 mg qhs with caution for possible side effects. Small, mostly nontender and non-fluctuant papule under her left breast.  Warm compresses and return for increased pain, redness, or  fluctuance.  Carolann Littler, MD

## 2013-06-28 NOTE — Progress Notes (Signed)
Pre visit review using our clinic review tool, if applicable. No additional management support is needed unless otherwise documented below in the visit note. 

## 2013-06-28 NOTE — Patient Instructions (Signed)
Pruritus   Pruritis is an itch. There are many different problems that can cause an itch. Dry skin is one of the most common causes of itching. Most cases of itching do not require medical attention.   HOME CARE INSTRUCTIONS   Make sure your skin is moistened on a regular basis. A moisturizer that contains petroleum jelly is best for keeping moisture in your skin. If you develop a rash, you may try the following for relief:    Use corticosteroid cream.   Apply cool compresses to the affected areas.   Bathe with Epsom salts or baking soda in the bathwater.   Soak in colloidal oatmeal baths. These are available at your pharmacy.   Apply baking soda paste to the rash. Stir water into baking soda until it reaches a paste-like consistency.   Use an anti-itch lotion.   Take over-the-counter diphenhydramine medicine by mouth as the instructions direct.   Avoid scratching. Scratching may cause the rash to become infected. If itching is very bad, your caregiver may suggest prescription lotions or creams to lessen your symptoms.   Avoid hot showers, which can make itching worse. A cold shower may help with itching as long as you use a moisturizer after the shower.  SEEK MEDICAL CARE IF:  The itching does not go away after several days.  Document Released: 10/31/2010 Document Revised: 05/13/2011 Document Reviewed: 10/31/2010  [employer] Patient Information 2014 [employer].

## 2013-07-05 ENCOUNTER — Ambulatory Visit (INDEPENDENT_AMBULATORY_CARE_PROVIDER_SITE_OTHER): Payer: Commercial Managed Care - HMO | Admitting: Family Medicine

## 2013-07-05 ENCOUNTER — Encounter: Payer: Self-pay | Admitting: Family Medicine

## 2013-07-05 VITALS — BP 136/72 | HR 76 | Temp 98.3°F | Wt 185.0 lb

## 2013-07-05 DIAGNOSIS — L723 Sebaceous cyst: Secondary | ICD-10-CM

## 2013-07-05 DIAGNOSIS — L720 Epidermal cyst: Secondary | ICD-10-CM

## 2013-07-05 NOTE — Progress Notes (Signed)
   Subjective:    Patient ID: Alison Garza, female    DOB: 20-Apr-1945, 67 y.o.   MRN: [id number]  HPI Small cystic swelling under her left breast.  Just noted a couple of weeks ago. When seen last week mild erythema and indurated and minimally tender.  We recommended warm compresses and observe.  Redness has improved and no drainage over weekend.  Minimally tender.  Past Medical History  Diagnosis Date  . COLONIC POLYPS 05/29/2009  . HYPERLIPIDEMIA 02/29/2008  . GERD 05/29/2009  . OVERACTIVE BLADDER 02/29/2008  . MENOPAUSE, SURGICAL 02/29/2008  . Lumbago 03/12/2010  . Arthritis   . IBS (irritable bowel syndrome)   . Sciatic nerve pain     right  . Ruptured lumbar disc    Past Surgical History  Procedure Laterality Date  . Cholecystectomy  1984  . Kidney surgery  1980    to rotate kidney  . Colon surgery  2006    polyp removed  . Appendectomy  1973  . Ovarian cyst removal  2009  . Cesarean section      x 2  . Abdominal hysterectomy  1976    fiboids    reports that she quit smoking about 45 years ago. Her smoking use included Cigarettes. She smoked 0.00 packs per day. She has never used smokeless tobacco. She reports that she does not drink alcohol or use illicit drugs. family history includes Cerebral aneurysm (age of onset: 72) in her brother; Deep vein thrombosis in her daughter; Hyperlipidemia in her sister; Pancreatic cancer in her mother; Stroke in an other family member. There is no history of Colon cancer or Esophageal cancer. Allergies  Allergen Reactions  . Ciprofloxacin     Upset Gi  . Codeine Nausea Only  . Sulfonamide Derivatives Nausea And Vomiting  . Penicillins Rash      Review of Systems  Constitutional: Negative for fever and chills.       Objective:   Physical Exam  Constitutional: She appears well-developed and well-nourished.  Cardiovascular: Normal rate.   Pulmonary/Chest: Effort normal and breath sounds normal. No respiratory distress. She  has no wheezes. She has no rales.  Skin:  Under left breast small (about 5 mm) indurated cystic swelling with no fluctuance and no significant erythema.          Assessment & Plan:  ?small epidermal cyst.  No evidence for abscess at this time.  Patient is interested in excision.  Will refer to general surgeon though she is reassured this appears benign.

## 2013-07-05 NOTE — Progress Notes (Signed)
Pre visit review using our clinic review tool, if applicable. No additional management support is needed unless otherwise documented below in the visit note. 

## 2013-07-05 NOTE — Patient Instructions (Signed)
Follow up promptly for any recurrent redness, increased pain, or increased swelling.

## 2013-07-06 ENCOUNTER — Telehealth: Payer: Self-pay | Admitting: Family Medicine

## 2013-07-06 NOTE — Telephone Encounter (Signed)
Pt has referral to [facility] and they need the referral sent to [facility].(referral in epic) Will schedule appt after referral

## 2013-07-14 ENCOUNTER — Other Ambulatory Visit: Payer: Self-pay | Admitting: Family Medicine

## 2013-07-15 NOTE — Telephone Encounter (Signed)
Pt has an appointment on Tuesday 07/20/13 and they need a referral.

## 2013-07-20 ENCOUNTER — Encounter (INDEPENDENT_AMBULATORY_CARE_PROVIDER_SITE_OTHER): Payer: Self-pay | Admitting: General Surgery

## 2013-07-20 ENCOUNTER — Ambulatory Visit (INDEPENDENT_AMBULATORY_CARE_PROVIDER_SITE_OTHER): Payer: Commercial Managed Care - HMO | Admitting: General Surgery

## 2013-07-20 VITALS — BP 122/82 | HR 84 | Temp 97.6°F | Resp 12 | Ht 59.5 in | Wt 181.8 lb

## 2013-07-20 DIAGNOSIS — N6089 Other benign mammary dysplasias of unspecified breast: Secondary | ICD-10-CM | POA: Insufficient documentation

## 2013-07-20 NOTE — Progress Notes (Signed)
Patient ID: Alison Garza, female   DOB: [birth year], 68 y.o.   MRN: [id number]  Chief Complaint  Patient presents with  . New Evaluation    eval left breast epidermoid cyst    HPI Alison Garza is a 68 y.o. female.  Are as to see the patient in consultation by Dr. Eduard Clos to evaluate her for a left breast cyst. The patient is a 67 year old black female who first noticed a small lump underneath her left breast about 3 weeks ago. The area has been sore to the touch. She denies any drainage from the area. She denies any fevers or chills. Her last mammogram showed no evidence of malignancy. She does not have any personal or family history of breast cancer  HPI  Past Medical History  Diagnosis Date  . COLONIC POLYPS 05/29/2009  . HYPERLIPIDEMIA 02/29/2008  . GERD 05/29/2009  . OVERACTIVE BLADDER 02/29/2008  . MENOPAUSE, SURGICAL 02/29/2008  . Lumbago 03/12/2010  . Arthritis   . IBS (irritable bowel syndrome)   . Sciatic nerve pain     right  . Ruptured lumbar disc     Past Surgical History  Procedure Laterality Date  . Cholecystectomy  1984  . Kidney surgery  1980    to rotate kidney  . Colon surgery  2006    polyp removed  . Appendectomy  1973  . Ovarian cyst removal  2009  . Cesarean section      x 2  . Abdominal hysterectomy  1976    fiboids    Family History  Problem Relation Age of Onset  . Pancreatic cancer Mother   . Hyperlipidemia Sister   . Cerebral aneurysm Brother 89  . Deep vein thrombosis Daughter   . Stroke      maternal family  . Colon cancer Neg Hx   . Esophageal cancer Neg Hx     Social History History  Substance Use Topics  . Smoking status: Former Smoker    Types: Cigarettes    Quit date: 03/04/1968  . Smokeless tobacco: Never Used  . Alcohol Use: No    Allergies  Allergen Reactions  . Ciprofloxacin     Upset Gi  . Codeine Nausea Only  . Sulfonamide Derivatives Nausea And Vomiting  . Penicillins Rash    Current Outpatient  Prescriptions  Medication Sig Dispense Refill  . atorvastatin (LIPITOR) 10 MG tablet Take 1 tablet (10 mg total) by mouth daily.  90 tablet  3  . clobetasol cream (TEMOVATE) 0.05 %       . estradiol (ESTRACE) 1 MG tablet Take 1 tablet (1 mg total) by mouth daily.  90 tablet  3  . fluticasone (FLONASE) 50 MCG/ACT nasal spray Place 2 sprays into the nose daily.  16 g  6  . furosemide (LASIX) 20 MG tablet TAKE 1 TABLET BY MOUTH EVERY DAY AS NEEDED FOR SEVERE LEG EDEMA  30 tablet  5  . hydrOXYzine (VISTARIL) 25 MG capsule Take 1 capsule (25 mg total) by mouth every 8 (eight) hours as needed.  30 capsule  3  . losartan (COZAAR) 50 MG tablet Take 1 tablet (50 mg total) by mouth daily.  90 tablet  3  . Multiple Vitamin (MULITIVITAMIN WITH MINERALS) TABS Take 1 tablet by mouth daily.        Marland Kitchen oxybutynin (DITROPAN-XL) 5 MG 24 hr tablet Take 1 tablet (5 mg total) by mouth daily as needed. For bladder spasms  30 tablet  11  .  pantoprazole (PROTONIX) 40 MG tablet Take 1 tablet (40 mg total) by mouth daily.  90 tablet  3  . Polyethyl Glycol-Propyl Glycol (SYSTANE OP) Apply to eye 3 (three) times daily.      Marland Kitchen triamcinolone cream (KENALOG) 0.1 % Apply topically 2 (two) times daily. For 1 week  30 g  0  . [DISCONTINUED] omeprazole (PRILOSEC OTC) 20 MG tablet Take 1 tablet (20 mg total) by mouth 2 (two) times daily.  180 tablet  3  . [DISCONTINUED] oxybutynin (DITROPAN-XL) 5 MG 24 hr tablet Take 5 mg by mouth daily as needed. For bladder spasms       No current facility-administered medications for this visit.    Review of Systems Review of Systems  Constitutional: Negative.   HENT: Negative.   Eyes: Negative.   Respiratory: Negative.   Cardiovascular: Negative.   Gastrointestinal: Negative.   Endocrine: Negative.   Genitourinary: Negative.   Musculoskeletal: Negative.   Skin: Negative.   Allergic/Immunologic: Negative.   Neurological: Negative.   Hematological: Negative.   Psychiatric/Behavioral:  Negative.     Blood pressure 122/82, pulse 84, temperature 97.6 F (36.4 C), temperature source Temporal, resp. rate 12, height 4' 11.5" (1.511 m), weight 181 lb 12.8 oz (82.464 kg).  Physical Exam Physical Exam  Constitutional: She is oriented to person, place, and time. She appears well-developed and well-nourished.  HENT:  Head: Normocephalic and atraumatic.  Eyes: Conjunctivae and EOM are normal. Pupils are equal, round, and reactive to light.  Neck: Normal range of motion. Neck supple.  Cardiovascular: Normal rate, regular rhythm and normal heart sounds.   Pulmonary/Chest: Effort normal and breath sounds normal.  There is a small palpable lump in the skin at the inframammary fold of the left breast in approximately the 7:00 position.  Abdominal: Soft. Bowel sounds are normal.  Musculoskeletal: Normal range of motion.  Lymphadenopathy:    She has no cervical adenopathy.  Neurological: She is alert and oriented to person, place, and time.  Skin: Skin is warm and dry.  Psychiatric: She has a normal mood and affect. Her behavior is normal.    Data Reviewed As above  Assessment    The patient appears to have a small sebaceous cysts at her inframammary fold that is causing her some discomfort. Because of this I think it would be reasonable to remove it. I've discussed with her in detail the risks and benefits of the operation to remove this cyst as well as some of the technical aspects and she understands and wishes to proceed     Plan    Plan for excision of sebaceous cyst from the left breast        Luella Cook III 07/20/2013, 12:06 PM

## 2013-08-02 ENCOUNTER — Encounter: Payer: Self-pay | Admitting: Family Medicine

## 2013-08-02 ENCOUNTER — Ambulatory Visit (INDEPENDENT_AMBULATORY_CARE_PROVIDER_SITE_OTHER): Payer: Commercial Managed Care - HMO | Admitting: Family Medicine

## 2013-08-02 VITALS — BP 122/84 | HR 87 | Temp 98.0°F | Ht 59.5 in | Wt 183.0 lb

## 2013-08-02 DIAGNOSIS — J310 Chronic rhinitis: Secondary | ICD-10-CM

## 2013-08-02 DIAGNOSIS — J329 Chronic sinusitis, unspecified: Secondary | ICD-10-CM

## 2013-08-02 MED ORDER — BENZONATATE 100 MG PO CAPS: 100.0000 mg | ORAL_CAPSULE | Freq: Two times a day (BID) | ORAL | Status: DC | PRN

## 2013-08-02 NOTE — Patient Instructions (Signed)
-  AFRIN at night for 3-4 days then STOP  -continue your allergy treatments  -try the cough medication  -follow up if worsens or if persists in 5-7 days

## 2013-08-02 NOTE — Progress Notes (Signed)
No chief complaint on file.   HPI:  -started: about 6 days ago -symptoms:nasal congestion, sore throat, cough - getting better but now drainage in throat and cough persists -denies:fever, SOB, NVD, tooth pain -has tried: flonase, claritin -sick contacts/travel/risks: denies flu exposure, tick exposure or or Ebola risks -Hx of: allergies  ROS: See pertinent positives and negatives per HPI.  Past Medical History  Diagnosis Date  . COLONIC POLYPS 05/29/2009  . HYPERLIPIDEMIA 02/29/2008  . GERD 05/29/2009  . OVERACTIVE BLADDER 02/29/2008  . MENOPAUSE, SURGICAL 02/29/2008  . Lumbago 03/12/2010  . Arthritis   . IBS (irritable bowel syndrome)   . Sciatic nerve pain     right  . Ruptured lumbar disc     Past Surgical History  Procedure Laterality Date  . Cholecystectomy  1984  . Kidney surgery  1980    to rotate kidney  . Colon surgery  2006    polyp removed  . Appendectomy  1973  . Ovarian cyst removal  2009  . Cesarean section      x 2  . Abdominal hysterectomy  1976    fiboids    Family History  Problem Relation Age of Onset  . Pancreatic cancer Mother   . Hyperlipidemia Sister   . Cerebral aneurysm Brother 19  . Deep vein thrombosis Daughter   . Stroke      maternal family  . Colon cancer Neg Hx   . Esophageal cancer Neg Hx     History   Social History  . Marital Status: Married    Spouse Name: N/A    Number of Children: 2  . Years of Education: N/A   Occupational History  . retired    Social History Main Topics  . Smoking status: Former Smoker    Types: Cigarettes    Quit date: 03/04/1968  . Smokeless tobacco: Never Used  . Alcohol Use: No  . Drug Use: No  . Sexual Activity: None   Other Topics Concern  . None   Social History Narrative  . None    Current outpatient prescriptions:atorvastatin (LIPITOR) 10 MG tablet, Take 1 tablet (10 mg total) by mouth daily., Disp: 90 tablet, Rfl: 3;  clobetasol cream (TEMOVATE) 0.05 %, , Disp: , Rfl: ;   estradiol (ESTRACE) 1 MG tablet, Take 1 tablet (1 mg total) by mouth daily., Disp: 90 tablet, Rfl: 3;  fluticasone (FLONASE) 50 MCG/ACT nasal spray, Place 2 sprays into the nose daily., Disp: 16 g, Rfl: 6 furosemide (LASIX) 20 MG tablet, TAKE 1 TABLET BY MOUTH EVERY DAY AS NEEDED FOR SEVERE LEG EDEMA, Disp: 30 tablet, Rfl: 5;  losartan (COZAAR) 50 MG tablet, Take 1 tablet (50 mg total) by mouth daily., Disp: 90 tablet, Rfl: 3;  Multiple Vitamin (MULITIVITAMIN WITH MINERALS) TABS, Take 1 tablet by mouth daily.  , Disp: , Rfl:  oxybutynin (DITROPAN-XL) 5 MG 24 hr tablet, Take 1 tablet (5 mg total) by mouth daily as needed. For bladder spasms, Disp: 30 tablet, Rfl: 11;  pantoprazole (PROTONIX) 40 MG tablet, Take 1 tablet (40 mg total) by mouth daily., Disp: 90 tablet, Rfl: 3;  Polyethyl Glycol-Propyl Glycol (SYSTANE OP), Apply to eye 3 (three) times daily., Disp: , Rfl:  triamcinolone cream (KENALOG) 0.1 %, Apply topically 2 (two) times daily. For 1 week, Disp: 30 g, Rfl: 0;  benzonatate (TESSALON) 100 MG capsule, Take 1 capsule (100 mg total) by mouth 2 (two) times daily as needed for cough., Disp: 20 capsule, Rfl: 0;  [  DISCONTINUED] omeprazole (PRILOSEC OTC) 20 MG tablet, Take 1 tablet (20 mg total) by mouth 2 (two) times daily., Disp: 180 tablet, Rfl: 3 [DISCONTINUED] oxybutynin (DITROPAN-XL) 5 MG 24 hr tablet, Take 5 mg by mouth daily as needed. For bladder spasms, Disp: , Rfl:   EXAM:  Filed Vitals:   08/02/13 1410  BP: 122/84  Pulse: 87  Temp: 98 F (36.7 C)    Body mass index is 36.36 kg/(m^2).  GENERAL: vitals reviewed and listed above, alert, oriented, appears well hydrated and in no acute distress  HEENT: atraumatic, conjunttiva clear, no obvious abnormalities on inspection of external nose and ears, normal appearance of ear canals and TMs, clear nasal congestion, mild post oropharyngeal erythema with PND, no tonsillar edema or exudate, no sinus TTP  NECK: no obvious masses on  inspection  LUNGS: clear to auscultation bilaterally, no wheezes, rales or rhonchi, good air movement  CV: HRRR, no peripheral edema  MS: moves all extremities without noticeable abnormality  PSYCH: pleasant and cooperative, no obvious depression or anxiety  ASSESSMENT AND PLAN:  Discussed the following assessment and plan:  Rhinosinusitis - Plan: benzonatate (TESSALON) 100 MG capsule  -given HPI and exam findings today, a serious infection or illness is unlikely. We discussed potential etiologies, with VURI being most likely, and advised supportive care and monitoring. We discussed treatment side effects, likely course, antibiotic misuse, transmission, and signs of developing a serious illness. -of course, we advised to return or notify a doctor immediately if symptoms worsen or persist or new concerns arise.    Patient Instructions  -AFRIN at night for 3-4 days then STOP  -continue your allergy treatments  -try the cough medication  -follow up if worsens or if persists in 5-7 days     Alison Garza

## 2013-08-02 NOTE — Progress Notes (Signed)
Pre visit review using our clinic review tool, if applicable. No additional management support is needed unless otherwise documented below in the visit note. 

## 2013-08-06 ENCOUNTER — Other Ambulatory Visit (INDEPENDENT_AMBULATORY_CARE_PROVIDER_SITE_OTHER): Payer: Self-pay

## 2013-08-06 ENCOUNTER — Other Ambulatory Visit (INDEPENDENT_AMBULATORY_CARE_PROVIDER_SITE_OTHER): Payer: Self-pay | Admitting: General Surgery

## 2013-08-06 DIAGNOSIS — N6009 Solitary cyst of unspecified breast: Secondary | ICD-10-CM

## 2013-08-06 MED ORDER — HYDROCODONE-ACETAMINOPHEN 5-325 MG PO TABS
1.0000 | ORAL_TABLET | Freq: Four times a day (QID) | ORAL | Status: DC | PRN
Start: 2013-08-06 — End: 2013-08-26

## 2013-08-11 ENCOUNTER — Ambulatory Visit (INDEPENDENT_AMBULATORY_CARE_PROVIDER_SITE_OTHER): Payer: Commercial Managed Care - HMO | Admitting: Family Medicine

## 2013-08-11 ENCOUNTER — Encounter: Payer: Self-pay | Admitting: Family Medicine

## 2013-08-11 VITALS — BP 130/80 | HR 75 | Temp 98.4°F | Wt 183.0 lb

## 2013-08-11 DIAGNOSIS — R05 Cough: Secondary | ICD-10-CM

## 2013-08-11 DIAGNOSIS — R059 Cough, unspecified: Secondary | ICD-10-CM

## 2013-08-11 MED ORDER — HYDROCODONE-HOMATROPINE 5-1.5 MG/5ML PO SYRP: 5.0000 mL | ORAL_SOLUTION | Freq: Four times a day (QID) | ORAL | Status: AC | PRN

## 2013-08-11 NOTE — Progress Notes (Signed)
   Subjective:    Patient ID: Alison Garza, female    DOB: [birth year], 68 y.o.   MRN: [id number]  HPI Followup cough. Onset about 2 weeks ago. She initially had some postnasal drip symptoms. She was prescribed Tessalon which has not helped. No fever. No GERD symptoms. No active nasal discharge. Cough is nonproductive. No obvious wheezing. No fevers or chills. Nonsmoker. Has tried over-the-counter cough drops and medication without improvement.  Past Medical History  Diagnosis Date  . COLONIC POLYPS 05/29/2009  . HYPERLIPIDEMIA 02/29/2008  . GERD 05/29/2009  . OVERACTIVE BLADDER 02/29/2008  . MENOPAUSE, SURGICAL 02/29/2008  . Lumbago 03/12/2010  . Arthritis   . IBS (irritable bowel syndrome)   . Sciatic nerve pain     right  . Ruptured lumbar disc    Past Surgical History  Procedure Laterality Date  . Cholecystectomy  1984  . Kidney surgery  1980    to rotate kidney  . Colon surgery  2006    polyp removed  . Appendectomy  1973  . Ovarian cyst removal  2009  . Cesarean section      x 2  . Abdominal hysterectomy  1976    fiboids    reports that she quit smoking about 45 years ago. Her smoking use included Cigarettes. She smoked 0.00 packs per day. She has never used smokeless tobacco. She reports that she does not drink alcohol or use illicit drugs. family history includes Cerebral aneurysm (age of onset: 9) in her brother; Deep vein thrombosis in her daughter; Hyperlipidemia in her sister; Pancreatic cancer in her mother; Stroke in an other family member. There is no history of Colon cancer or Esophageal cancer. Allergies  Allergen Reactions  . Ciprofloxacin     Upset Gi  . Codeine Nausea Only  . Sulfonamide Derivatives Nausea And Vomiting  . Penicillins Rash      Review of Systems  Constitutional: Negative for fever and chills.  HENT: Negative for congestion.   Respiratory: Positive for cough. Negative for shortness of breath and wheezing.   Cardiovascular: Negative  for chest pain.       Objective:   Physical Exam  Constitutional: She appears well-developed and well-nourished.  HENT:  Right Ear: External ear normal.  Left Ear: External ear normal.  Mouth/Throat: Oropharynx is clear and moist.  Neck: Neck supple. No thyromegaly present.  Cardiovascular: Normal rate.   Pulmonary/Chest: Effort normal and breath sounds normal. No respiratory distress. She has no wheezes. She has no rales.  Lymphadenopathy:    She has no cervical adenopathy.          Assessment & Plan:  Cough probably secondary to viral bronchitis. Hycodan cough syrup 1 teaspoon Q6 hours for severe cough. Followup when necessary

## 2013-08-11 NOTE — Patient Instructions (Signed)
Acute Bronchitis Bronchitis is inflammation of the airways that extend from the windpipe into the lungs (bronchi). The inflammation often causes mucus to develop. This leads to a cough, which is the most common symptom of bronchitis.  In acute bronchitis, the condition usually develops suddenly and goes away over time, usually in a couple weeks. Smoking, allergies, and asthma can make bronchitis worse. Repeated episodes of bronchitis may cause further lung problems.  CAUSES Acute bronchitis is most often caused by the same virus that causes a cold. The virus can spread from person to person (contagious).  SIGNS AND SYMPTOMS   Cough.   Fever.   Coughing up mucus.   Body aches.   Chest congestion.   Chills.   Shortness of breath.   Sore throat.  DIAGNOSIS  Acute bronchitis is usually diagnosed through a physical exam. Tests, such as chest X-rays, are sometimes done to rule out other conditions.  TREATMENT  Acute bronchitis usually goes away in a couple weeks. Often times, no medical treatment is necessary. Medicines are sometimes given for relief of fever or cough. Antibiotics are usually not needed but may be prescribed in certain situations. In some cases, an inhaler may be recommended to help reduce shortness of breath and control the cough. A cool mist vaporizer may also be used to help thin bronchial secretions and make it easier to clear the chest.  HOME CARE INSTRUCTIONS  Get plenty of rest.   Drink enough fluids to keep your urine clear or pale yellow (unless you have a medical condition that requires fluid restriction). Increasing fluids may help thin your secretions and will prevent dehydration.   Only take over-the-counter or prescription medicines as directed by your health care provider.   Avoid smoking and secondhand smoke. Exposure to cigarette smoke or irritating chemicals will make bronchitis worse. If you are a smoker, consider using nicotine gum or skin  patches to help control withdrawal symptoms. Quitting smoking will help your lungs heal faster.   Reduce the chances of another bout of acute bronchitis by washing your hands frequently, avoiding people with cold symptoms, and trying not to touch your hands to your mouth, nose, or eyes.   Follow up with your health care provider as directed.  SEEK MEDICAL CARE IF: Your symptoms do not improve after 1 week of treatment.  SEEK IMMEDIATE MEDICAL CARE IF:  You develop an increased fever or chills.   You have chest pain.   You have severe shortness of breath.  You have bloody sputum.   You develop dehydration.  You develop fainting.  You develop repeated vomiting.  You develop a severe headache. MAKE SURE YOU:   Understand these instructions.  Will watch your condition.  Will get help right away if you are not doing well or get worse. Document Released: 03/28/2004 Document Revised: 10/21/2012 Document Reviewed: 08/11/2012 [employer] Patient Information 2014 [employer].  

## 2013-08-11 NOTE — Progress Notes (Signed)
Pre visit review using our clinic review tool, if applicable. No additional management support is needed unless otherwise documented below in the visit note. 

## 2013-08-18 ENCOUNTER — Telehealth (INDEPENDENT_AMBULATORY_CARE_PROVIDER_SITE_OTHER): Payer: Self-pay

## 2013-08-18 NOTE — Telephone Encounter (Signed)
LMOM> path benign

## 2013-08-26 ENCOUNTER — Ambulatory Visit (INDEPENDENT_AMBULATORY_CARE_PROVIDER_SITE_OTHER): Payer: Commercial Managed Care - HMO | Admitting: General Surgery

## 2013-08-26 ENCOUNTER — Encounter (INDEPENDENT_AMBULATORY_CARE_PROVIDER_SITE_OTHER): Payer: Self-pay | Admitting: General Surgery

## 2013-08-26 VITALS — BP 122/84 | HR 66 | Resp 14 | Ht 59.5 in | Wt 181.8 lb

## 2013-08-26 DIAGNOSIS — N6082 Other benign mammary dysplasias of left breast: Secondary | ICD-10-CM

## 2013-08-26 DIAGNOSIS — N6089 Other benign mammary dysplasias of unspecified breast: Secondary | ICD-10-CM

## 2013-08-26 NOTE — Progress Notes (Signed)
Subjective:     Patient ID: Alison Garza, female   DOB: 1945-11-24, 68 y.o.   MRN: [id number]  HPI The patient is a 68 year old black female who is about 2 weeks status post excision of a cyst from the left breast. She has had no problems since surgery. She denies any pain.  Review of Systems     Objective:   Physical Exam On exam her left breast incision is healing nicely with no sign of infection or significant seroma    Assessment:     The patient is 2 weeks status post excision of a cyst on the left breast     Plan:     At this point she may return to her normal activities. She should continue to do regular self exams. I will plan to see her back on a when necessary basis

## 2013-08-26 NOTE — Patient Instructions (Signed)
May return to normal activities 

## 2013-09-06 ENCOUNTER — Encounter: Payer: Self-pay | Admitting: Family Medicine

## 2013-09-06 ENCOUNTER — Ambulatory Visit (INDEPENDENT_AMBULATORY_CARE_PROVIDER_SITE_OTHER): Payer: Commercial Managed Care - HMO | Admitting: Family Medicine

## 2013-09-06 VITALS — BP 128/78 | HR 85 | Wt 184.0 lb

## 2013-09-06 DIAGNOSIS — R600 Localized edema: Secondary | ICD-10-CM

## 2013-09-06 DIAGNOSIS — R609 Edema, unspecified: Secondary | ICD-10-CM

## 2013-09-06 LAB — BASIC METABOLIC PANEL
BUN: 15 mg/dL (ref 6–23)
CO2: 28 mEq/L (ref 19–32)
CREATININE: 0.7 mg/dL (ref 0.4–1.2)
Calcium: 9 mg/dL (ref 8.4–10.5)
Chloride: 102 mEq/L (ref 96–112)
GFR: 100.34 mL/min (ref >60.00)
Glucose, Bld: 86 mg/dL (ref 70–99)
Potassium: 3.1 mEq/L — ABNORMAL LOW (ref 3.5–5.1)
Sodium: 138 mEq/L (ref 135–145)

## 2013-09-06 LAB — BRAIN NATRIURETIC PEPTIDE: PRO B NATRI PEPTIDE: 18 pg/mL (ref 0.0–100.0)

## 2013-09-06 MED ORDER — POTASSIUM CHLORIDE CRYS ER 20 MEQ PO TBCR: 20.0000 meq | EXTENDED_RELEASE_TABLET | Freq: Every day | ORAL | Status: DC

## 2013-09-06 MED ORDER — FUROSEMIDE 40 MG PO TABS: 40.0000 mg | ORAL_TABLET | Freq: Every day | ORAL | Status: DC

## 2013-09-06 NOTE — Patient Instructions (Signed)
Take Lasix 40 mg daily for 3 days then reduce back to 20 mg (one half tablet) daily until follow up.

## 2013-09-06 NOTE — Progress Notes (Signed)
   Subjective:    Patient ID: Alison Garza, female    DOB: Mar 12, 1945, 69 y.o.   MRN: [id number]  HPI Bilateral leg edema. Worsening over for the past week or so. Her weight is up only 1 pound. She's had similar issues in the past. She has previously been on furosemide 20 mg daily as needed but states this has not helped much. She denies any recent dietary change. She did take some occasional Aleve recently. She has some dyspnea with activity but no orthopnea and no chest pains. Her medications are reviewed. She does not take any calcium channel blocker or other medications that would likely be exacerbating edema of the nonsteroidal above. No history of heart failure.  Past Medical History  Diagnosis Date  . COLONIC POLYPS 05/29/2009  . HYPERLIPIDEMIA 02/29/2008  . GERD 05/29/2009  . OVERACTIVE BLADDER 02/29/2008  . MENOPAUSE, SURGICAL 02/29/2008  . Lumbago 03/12/2010  . Arthritis   . IBS (irritable bowel syndrome)   . Sciatic nerve pain     right  . Ruptured lumbar disc    Past Surgical History  Procedure Laterality Date  . Cholecystectomy  1984  . Kidney surgery  1980    to rotate kidney  . Colon surgery  2006    polyp removed  . Appendectomy  1973  . Ovarian cyst removal  2009  . Cesarean section      x 2  . Abdominal hysterectomy  1976    fiboids    reports that she quit smoking about 45 years ago. Her smoking use included Cigarettes. She smoked 0.00 packs per day. She has never used smokeless tobacco. She reports that she does not drink alcohol or use illicit drugs. family history includes Cerebral aneurysm (age of onset: 72) in her brother; Deep vein thrombosis in her daughter; Hyperlipidemia in her sister; Pancreatic cancer in her mother; Stroke in an other family member. There is no history of Colon cancer or Esophageal cancer. Allergies  Allergen Reactions  . Ciprofloxacin     Upset Gi  . Codeine Nausea Only  . Sulfonamide Derivatives Nausea And Vomiting  .  Penicillins Rash      Review of Systems  Constitutional: Negative for fatigue.  Eyes: Negative for visual disturbance.  Respiratory: Positive for shortness of breath. Negative for cough, chest tightness and wheezing.   Cardiovascular: Positive for leg swelling. Negative for chest pain and palpitations.  Endocrine: Negative for polydipsia and polyuria.  Neurological: Negative for dizziness, seizures, syncope, weakness, light-headedness and headaches.       Objective:   Physical Exam  Constitutional: She appears well-developed and well-nourished. No distress.  Neck: Neck supple. No JVD present. No thyromegaly present.  Cardiovascular: Normal rate and regular rhythm.  Exam reveals no gallop.   Pulmonary/Chest: Effort normal and breath sounds normal. No respiratory distress. She has no wheezes. She has no rales. She exhibits no tenderness.  Musculoskeletal: She exhibits edema.  Trace to 1+ pitting edema feet legs bilaterally          Assessment & Plan:  Bilateral leg edema. She's not had any history of heart failure. Avoid nonsteroidals and other aggravating factors. Elevate legs frequently. Check basic metabolic panel and BNP level. Increased furosemide 40 mg daily-for 3 days then drop back to 20 mg daily. Add potassium 20 mEq daily with her furosemide. Reassess 2 weeks

## 2013-09-06 NOTE — Progress Notes (Signed)
Pre visit review using our clinic review tool, if applicable. No additional management support is needed unless otherwise documented below in the visit note. 

## 2013-09-20 ENCOUNTER — Ambulatory Visit (INDEPENDENT_AMBULATORY_CARE_PROVIDER_SITE_OTHER): Payer: Commercial Managed Care - HMO | Admitting: Family Medicine

## 2013-09-20 ENCOUNTER — Encounter: Payer: Self-pay | Admitting: Family Medicine

## 2013-09-20 VITALS — BP 126/78 | HR 74 | Wt 183.0 lb

## 2013-09-20 DIAGNOSIS — E876 Hypokalemia: Secondary | ICD-10-CM

## 2013-09-20 DIAGNOSIS — M25561 Pain in right knee: Secondary | ICD-10-CM

## 2013-09-20 DIAGNOSIS — M25569 Pain in unspecified knee: Secondary | ICD-10-CM

## 2013-09-20 DIAGNOSIS — M1710 Unilateral primary osteoarthritis, unspecified knee: Secondary | ICD-10-CM

## 2013-09-20 DIAGNOSIS — R600 Localized edema: Secondary | ICD-10-CM

## 2013-09-20 DIAGNOSIS — M170 Bilateral primary osteoarthritis of knee: Secondary | ICD-10-CM

## 2013-09-20 DIAGNOSIS — R609 Edema, unspecified: Secondary | ICD-10-CM

## 2013-09-20 LAB — BASIC METABOLIC PANEL
BUN: 18 mg/dL (ref 6–23)
CALCIUM: 8.9 mg/dL (ref 8.4–10.5)
CO2: 27 meq/L (ref 19–32)
CREATININE: 0.9 mg/dL (ref 0.4–1.2)
Chloride: 105 mEq/L (ref 96–112)
GFR: 83.24 mL/min (ref >60.00)
Glucose, Bld: 77 mg/dL (ref 70–99)
Potassium: 3.7 mEq/L (ref 3.5–5.1)
Sodium: 139 mEq/L (ref 135–145)

## 2013-09-20 MED ORDER — METHYLPREDNISOLONE ACETATE 80 MG/ML IJ SUSP
80.0000 mg | Freq: Once | INTRAMUSCULAR | Status: AC
  Administered 2013-09-20: 80 mg via INTRAMUSCULAR

## 2013-09-20 NOTE — Patient Instructions (Signed)
Potassium Content of Foods  Potassium is a mineral found in many foods and drinks. It helps keep fluids and minerals balanced in your body and affects how steadily your heart beats. Potassium also helps control your blood pressure and keep your muscles and nervous system healthy.  Certain health conditions and medicines may change the balance of potassium in your body. When this happens, you can help balance your level of potassium through the foods that you do or do not eat. Your health care provider or dietitian may recommend an amount of potassium that you should have each day. The following lists of foods provide the amount of potassium (in parentheses) per serving in each item.  HIGH IN POTASSIUM   The following foods and beverages have 200 mg or more of potassium per serving:  · Apricots, 2 raw or 5 dry (200 mg).  · Artichoke, 1 medium (345 mg).  · Avocado, raw,  ¼ each (245 mg).  · Banana, 1 medium (425 mg).  · Beans, lima, or baked beans, canned, ½ cup (280 mg).  · Beans, white, canned, ½ cup (595 mg).  · Beef roast, 3 oz (320 mg).  · Beef, ground, 3 oz (270 mg).  · Beets, raw or cooked, ½ cup (260 mg).  · Bran muffin, 2 oz (300 mg).  · Broccoli, ½ cup (230 mg).  · Brussels sprouts, ½ cup (250 mg).  · Cantaloupe, ½ cup (215 mg).  · Cereal, 100% bran, ½ cup (200-400 mg).  · Cheeseburger, single, fast food, 1 each (225-400 mg).  · Chicken, 3 oz (220 mg).  · Clams, canned, 3 oz (535 mg).  · Crab, 3 oz (225 mg).  · Dates, 5 each (270 mg).  · Dried beans and peas, ½ cup (300-475 mg).  · Figs, dried, 2 each (260 mg).  · Fish: halibut, tuna, cod, snapper, 3 oz (480 mg).  · Fish: salmon, haddock, swordfish, perch, 3 oz (300 mg).  · Fish, tuna, canned 3 oz (200 mg).  · French fries, fast food, 3 oz (470 mg).  · Granola with fruit and nuts, ½ cup (200 mg).  · Grapefruit juice, ½ cup (200 mg).  · Greens, beet, ½ cup (655 mg).  · Honeydew melon, ½ cup (200 mg).  · Kale, raw, 1 cup (300 mg).  · Kiwi, 1 medium (240  mg).  · Kohlrabi, rutabaga, parsnips, ½ cup (280 mg).  · Lentils, ½ cup (365 mg).  · Mango, 1 each (325 mg).  · Milk, chocolate, 1 cup (420 mg).  · Milk: nonfat, low-fat, whole, buttermilk, 1 cup (350-380 mg).  · Molasses, 1 Tbsp (295 mg).  · Mushrooms, ½ cup (280) mg.  · Nectarine, 1 each (275 mg).  · Nuts: almonds, peanuts, hazelnuts, [place], cashew, mixed, 1 oz (200 mg).  · Nuts, pistachios, 1 oz (295 mg).  · Orange, 1 each (240 mg).  · Orange juice, ½ cup (235 mg).  · Papaya, medium, ½ fruit (390 mg).  · Peanut butter, chunky, 2 Tbsp (240 mg).  · Peanut butter, smooth, 2 Tbsp (210 mg).  · Pear, 1 medium (200 mg).  · Pomegranate, 1 whole (400 mg).  · Pomegranate juice, ½ cup (215 mg).  · Pork, 3 oz (350 mg).  · Potato chips, salted, 1 oz (465 mg).  · Potato, baked with skin, 1 medium (925 mg).  · Potatoes, boiled, ½ cup (255 mg).  · Potatoes, mashed, ½ cup (330 mg).  · Prune juice, ½ cup (  370 mg).  · Prunes, 5 each (305 mg).  · Pudding, chocolate, ½ cup (230 mg).  · Pumpkin, canned, ½ cup (250 mg).  · Raisins, seedless, ¼ cup (270 mg).  · Seeds, sunflower or pumpkin, 1 oz (240 mg).  · Soy milk, 1 cup (300 mg).  · Spinach, ½ cup (420 mg).  · Spinach, canned, ½ cup (370 mg).  · Sweet potato, baked with skin, 1 medium (450 mg).  · Swiss chard, ½ cup (480 mg).  · Tomato or vegetable juice, ½ cup (275 mg).  · Tomato sauce or puree, ½ cup (400-550 mg).  · Tomato, raw, 1 medium (290 mg).  · Tomatoes, canned, ½ cup (200-300 mg).  · Turkey, 3 oz (250 mg).  · Wheat germ, 1 oz (250 mg).  · Winter squash, ½ cup (250 mg).  · Yogurt, plain or fruited, 6 oz (260-435 mg).  · Zucchini, ½ cup (220 mg).  MODERATE IN POTASSIUM  The following foods and beverages have 50-200 mg of potassium per serving:  · Apple, 1 each (150 mg).  · Apple juice, ½ cup (150 mg).  · Applesauce, ½ cup (90 mg).  · Apricot nectar, ½ cup (140 mg).  · Asparagus, small spears, ½ cup or 6 spears (155 mg).  · Bagel, cinnamon raisin, 1 each (130 mg).  · Bagel,  egg or plain, 4 in., 1 each (70 mg).  · Beans, green, ½ cup (90 mg).  · Beans, yellow, ½ cup (190 mg).  · Beer, regular, 12 oz (100 mg).  · Beets, canned, ½ cup (125 mg).  · Blackberries, ½ cup (115 mg).  · Blueberries, ½ cup (60 mg).  · Bread, whole wheat, 1 slice (70 mg).  · Broccoli, raw, ½ cup (145 mg).  · Cabbage, ½ cup (150 mg).  · Carrots, cooked or raw, ½ cup (180 mg).  · Cauliflower, raw, ½ cup (150 mg).  · Celery, raw, ½ cup (155 mg).  · Cereal, bran flakes, ½cup (120-150 mg).  · Cheese, cottage, ½ cup (110 mg).  · Cherries, 10 each (150 mg).  · Chocolate, 1½ oz bar (165 mg).  · Coffee, brewed 6 oz (90 mg).  · Corn, ½ cup or 1 ear (195 mg).  · Cucumbers, ½ cup (80 mg).  · Egg, large, 1 each (60 mg).  · Eggplant, ½ cup (60 mg).  · Endive, raw, ½cup (80 mg).  · English muffin, 1 each (65 mg).  · Fish, orange roughy, 3 oz (150 mg).  · Frankfurter, beef or pork, 1 each (75 mg).  · Fruit cocktail, ½ cup (115 mg).  · Grape juice, ½ cup (170 mg).  · Grapefruit, ½ fruit (175 mg).  · Grapes, ½ cup (155 mg).  · Greens: kale, turnip, collard, ½ cup (110-150 mg).  · Ice cream or frozen yogurt, chocolate, ½ cup (175 mg).  · Ice cream or frozen yogurt, vanilla, ½ cup (120-150 mg).  · Lemons, limes, 1 each (80 mg).  · Lettuce, all types, 1 cup (100 mg).  · Mixed vegetables, ½ cup (150 mg).  · Mushrooms, raw, ½ cup (110 mg).  · Nuts: walnuts, pecans, or macadamia, 1 oz (125 mg).  · Oatmeal, ½ cup (80 mg).  · Okra, ½ cup (110 mg).  · Onions, raw, ½ cup (120 mg).  · Peach, 1 each (185 mg).  · Peaches, canned, ½ cup (120 mg).  · Pears, canned, ½ cup (120 mg).  · Peas, green,   frozen, ½ cup (90 mg).  · Peppers, green, ½ cup (130 mg).  · Peppers, red, ½ cup (160 mg).  · Pineapple juice, ½ cup (165 mg).  · Pineapple, fresh or canned, ½ cup (100 mg).  · Plums, 1 each (105 mg).  · Pudding, vanilla, ½ cup (150 mg).  · Raspberries, ½ cup (90 mg).  · Rhubarb, ½ cup (115 mg).  · Rice, wild, ½ cup (80 mg).  · Shrimp, 3 oz (155  mg).  · Spinach, raw, 1 cup (170 mg).  · Strawberries, ½ cup (125 mg).  · Summer squash ½ cup (175-200 mg).  · Swiss chard, raw, 1 cup (135 mg).  · Tangerines, 1 each (140 mg).  · Tea, brewed, 6 oz (65 mg).  · Turnips, ½ cup (140 mg).  · Watermelon, ½ cup (85 mg).  · Wine, red, table, 5 oz (180 mg).  · Wine, white, table, 5 oz (100 mg).  LOW IN POTASSIUM  The following foods and beverages have less than 50 mg of potassium per serving.  · Bread, white, 1 slice (30 mg).  · Carbonated beverages, 12 oz (less than 5 mg).  · Cheese, 1 oz (20-30 mg).  · Cranberries, ½ cup (45 mg).  · Cranberry juice cocktail, ½ cup (20 mg).  · Fats and oils, 1 Tbsp (less than 5 mg).  · Hummus, 1 Tbsp (32 mg).  · Nectar: papaya, mango, or pear, ½ cup (35 mg).  · Rice, white or brown, ½ cup (50 mg).  · Spaghetti or macaroni, ½ cup cooked (30 mg).  · Tortilla, flour or corn, 1 each (50 mg).  · Waffle, 4 in., 1 each (50 mg).  · Water chestnuts, ½ cup (40 mg).  Document Released: 10/02/2004 Document Revised: 02/23/2013 Document Reviewed: 01/15/2013  [employer]® Patient Information ©2015 [employer]. This information is not intended to replace advice given to you by your health care provider. Make sure you discuss any questions you have with your health care provider.

## 2013-09-20 NOTE — Progress Notes (Signed)
   Subjective:    Patient ID: Alison Garza, female    DOB: 07-Aug-1945, 68 y.o.   MRN: [id number]  HPI Followup recent bilateral leg edema. We bumped up her Lasix. BNP level normal. Potassium 3.1. Edema did improve after 3 days of Lasix 40 mg daily. Weight is down 1 pound. No orthopnea. No recent dietary changes.  Patient complaint of right knee pain. Osteoarthritis involving both knees. Recent steroid injection left knee which did improve. She is requesting steroid injection of right knee today. No lower extremity weakness.    Past Medical History  Diagnosis Date  . COLONIC POLYPS 05/29/2009  . HYPERLIPIDEMIA 02/29/2008  . GERD 05/29/2009  . OVERACTIVE BLADDER 02/29/2008  . MENOPAUSE, SURGICAL 02/29/2008  . Lumbago 03/12/2010  . Arthritis   . IBS (irritable bowel syndrome)   . Sciatic nerve pain     right  . Ruptured lumbar disc    Past Surgical History  Procedure Laterality Date  . Cholecystectomy  1984  . Kidney surgery  1980    to rotate kidney  . Colon surgery  2006    polyp removed  . Appendectomy  1973  . Ovarian cyst removal  2009  . Cesarean section      x 2  . Abdominal hysterectomy  1976    fiboids    reports that she quit smoking about 45 years ago. Her smoking use included Cigarettes. She smoked 0.00 packs per day. She has never used smokeless tobacco. She reports that she does not drink alcohol or use illicit drugs. family history includes Cerebral aneurysm (age of onset: 30) in her brother; Deep vein thrombosis in her daughter; Hyperlipidemia in her sister; Pancreatic cancer in her mother; Stroke in an other family member. There is no history of Colon cancer or Esophageal cancer. Allergies  Allergen Reactions  . Ciprofloxacin     Upset Gi  . Codeine Nausea Only  . Sulfonamide Derivatives Nausea And Vomiting  . Penicillins Rash      Review of Systems  Constitutional: Negative for fever and chills.  Respiratory: Negative for cough, shortness of breath and  wheezing.   Cardiovascular: Positive for leg swelling. Negative for chest pain.  Gastrointestinal: Negative for abdominal pain.  Neurological: Negative for dizziness and syncope.  Hematological: Does not bruise/bleed easily.  Psychiatric/Behavioral: Negative for dysphoric mood.       Objective:   Physical Exam  Constitutional: She appears well-developed and well-nourished. No distress.  Cardiovascular: Normal rate and regular rhythm.   Pulmonary/Chest: Effort normal and breath sounds normal. No respiratory distress. She has no wheezes. She has no rales.  Musculoskeletal: She exhibits edema.  Trace pitting edema lower legs bilaterally. Right knee reveals mild crepitus with flexion and extension. No effusion. No warmth. Medial joint line tenderness          Assessment & Plan:  #1 bilateral leg edema. Improved. Continue furosemide 20 mg daily. Continue frequent elevation #2 hypokalemia. Recheck basic metabolic panel. Handout given on potassium rich diet #3 osteoarthritis mostly involving knees. Discussed risk and benefits of corticosteroid injection. Patient consented. Prepped right knee with Betadine. Using one and 1/2 inch 25-gauge needle injected 1 cc of Depo-Medrol 2 cc of plain Xylocaine using medial and inferior approach. Patient tolerated well.

## 2013-09-20 NOTE — Progress Notes (Signed)
Pre visit review using our clinic review tool, if applicable. No additional management support is needed unless otherwise documented below in the visit note. 

## 2013-09-21 ENCOUNTER — Telehealth: Payer: Self-pay

## 2013-09-21 DIAGNOSIS — M170 Bilateral primary osteoarthritis of knee: Secondary | ICD-10-CM | POA: Insufficient documentation

## 2013-09-21 NOTE — Telephone Encounter (Signed)
Pt informed about labs. Do you want her to continue to take the medication or stop. Pt is taking 2 tablets now, she wants to know can she only take one tablet if she has to stay on the medication.

## 2013-09-22 NOTE — Telephone Encounter (Signed)
Stay on one daily and may occasionally take two for edema flare ups.

## 2013-09-22 NOTE — Telephone Encounter (Signed)
Pt informed

## 2013-10-01 ENCOUNTER — Encounter: Payer: Self-pay | Admitting: Gastroenterology

## 2013-10-01 ENCOUNTER — Encounter: Payer: Self-pay | Admitting: Internal Medicine

## 2013-11-18 ENCOUNTER — Encounter: Payer: Self-pay | Admitting: Family Medicine

## 2013-11-18 ENCOUNTER — Ambulatory Visit (INDEPENDENT_AMBULATORY_CARE_PROVIDER_SITE_OTHER): Payer: Medicare HMO | Admitting: Family Medicine

## 2013-11-18 VITALS — BP 128/80 | HR 73 | Wt 179.0 lb

## 2013-11-18 DIAGNOSIS — M25569 Pain in unspecified knee: Secondary | ICD-10-CM

## 2013-11-18 DIAGNOSIS — Z23 Encounter for immunization: Secondary | ICD-10-CM

## 2013-11-18 DIAGNOSIS — M25561 Pain in right knee: Secondary | ICD-10-CM

## 2013-11-18 MED ORDER — ESTRADIOL 1 MG PO TABS: 1.0000 mg | ORAL_TABLET | Freq: Every day | ORAL | Status: AC

## 2013-11-18 MED ORDER — OMEPRAZOLE 20 MG PO CPDR: 20.0000 mg | DELAYED_RELEASE_CAPSULE | Freq: Two times a day (BID) | ORAL | Status: DC

## 2013-11-18 MED ORDER — LOSARTAN POTASSIUM 50 MG PO TABS: 50.0000 mg | ORAL_TABLET | Freq: Every day | ORAL | Status: DC

## 2013-11-18 MED ORDER — METHYLPREDNISOLONE ACETATE 40 MG/ML IJ SUSP
40.0000 mg | Freq: Once | INTRAMUSCULAR | Status: AC
  Administered 2013-11-18: 40 mg via INTRA_ARTICULAR

## 2013-11-18 NOTE — Patient Instructions (Signed)
Knee Injection Joint injections are shots. Your caregiver will place a needle into your knee joint. The needle is used to put medicine into the joint. These shots can be used to help treat different painful knee conditions such as osteoarthritis, bursitis, local flare-ups of rheumatoid arthritis, and pseudogout. Anti-inflammatory medicines such as corticosteroids and anesthetics are the most common medicines used for joint and soft tissue injections.  PROCEDURE  The skin over the kneecap will be cleaned with an antiseptic solution.  Your caregiver will inject a small amount of a local anesthetic (a medicine like Novocaine) just under the skin in the area that was cleaned.  After the area becomes numb, a second injection is done. This second injection usually includes an anesthetic and an anti-inflammatory medicine called a steroid or cortisone. The needle is carefully placed in between the kneecap and the knee, and the medicine is injected into the joint space.  After the injection is done, the needle is removed. Your caregiver may place a bandage over the injection site. The whole procedure takes no more than a couple of minutes. BEFORE THE PROCEDURE  Wash all of the skin around the entire knee area. Try to remove any loose, scaling skin. There is no other specific preparation necessary unless advised otherwise by your caregiver. LET YOUR CAREGIVER KNOW ABOUT:   Allergies.  Medications taken including herbs, eye drops, over the counter medications, and creams.  Use of steroids (by mouth or creams).  Possible pregnancy, if applicable.  Previous problems with anesthetics or Novocaine.  History of blood clots (thrombophlebitis).  History of bleeding or blood problems.  Previous surgery.  Other health problems. RISKS AND COMPLICATIONS Side effects from cortisone shots are rare. They include:   Slight bruising of the skin.  Shrinkage of the normal fatty tissue under the skin where  the shot was given.  Increase in pain after the shot.  Infection.  Weakening of tendons or tendon rupture.  Allergic reaction to the medicine.  Diabetics may have a temporary increase in their blood sugar after a shot.  Cortisone can temporarily weaken the immune system. While receiving these shots, you should not get certain vaccines. Also, avoid contact with anyone who has chickenpox or measles. Especially if you have never had these diseases or have not been previously immunized. Your immune system may not be strong enough to fight off the infection while the cortisone is in your system. AFTER THE PROCEDURE   You can go home after the procedure.  You may need to put ice on the joint 15-20 minutes every 3 or 4 hours until the pain goes away.  You may need to put an elastic bandage on the joint. HOME CARE INSTRUCTIONS   Only take over-the-counter or prescription medicines for pain, discomfort, or fever as directed by your caregiver.  You should avoid stressing the joint. Unless advised otherwise, avoid activities that put a lot of pressure on a knee joint, such as:  Jogging.  Bicycling.  Recreational climbing.  Hiking.  Laying down and elevating the leg/knee above the level of your heart can help to minimize swelling. SEEK MEDICAL CARE IF:   You have repeated or worsening swelling.  There is drainage from the puncture area.  You develop red streaking that extends above or below the site where the needle was inserted. SEEK IMMEDIATE MEDICAL CARE IF:   You develop a fever.  You have pain that gets worse even though you are taking pain medicine.  The area is   red and warm, and you have trouble moving the joint. MAKE SURE YOU:   Understand these instructions.  Will watch your condition.  Will get help right away if you are not doing well or get worse. Document Released: 05/12/2006 Document Revised: 05/13/2011 Document Reviewed: 02/06/2007 Exit[employer] Patient  Information 2015 [employer]. This information is not intended to replace advice given to you by your health care provider. Make sure you discuss any questions you have with your health care provider.  

## 2013-11-18 NOTE — Progress Notes (Signed)
Pre visit review using our clinic review tool, if applicable. No additional management support is needed unless otherwise documented below in the visit note. 

## 2013-11-18 NOTE — Progress Notes (Signed)
   Subjective:    Patient ID: Alison Garza, female    DOB: 07-20-1945, 68 y.o.   MRN: [id number]  Leg Pain     Persistent right knee pain. Refer to previous note. She denies any injury. Her location is mostly medial knee. no radiation.  She denies any locking or giving way. She's tried topical bio freeze with some improvement. She previously has responded well to steroid injections. She is requesting repeat today. She's not noticed any erythema or warmth. No ecchymosis. Pain is worse with first ambulating.  Past Medical History  Diagnosis Date  . COLONIC POLYPS 05/29/2009  . HYPERLIPIDEMIA 02/29/2008  . GERD 05/29/2009  . OVERACTIVE BLADDER 02/29/2008  . MENOPAUSE, SURGICAL 02/29/2008  . Lumbago 03/12/2010  . Arthritis   . IBS (irritable bowel syndrome)   . Sciatic nerve pain     right  . Ruptured lumbar disc    Past Surgical History  Procedure Laterality Date  . Cholecystectomy  1984  . Kidney surgery  1980    to rotate kidney  . Colon surgery  2006    polyp removed  . Appendectomy  1973  . Ovarian cyst removal  2009  . Cesarean section      x 2  . Abdominal hysterectomy  1976    fiboids    reports that she quit smoking about 45 years ago. Her smoking use included Cigarettes. She smoked 0.00 packs per day. She has never used smokeless tobacco. She reports that she does not drink alcohol or use illicit drugs. family history includes Cerebral aneurysm (age of onset: 47) in her brother; Deep vein thrombosis in her daughter; Hyperlipidemia in her sister; Pancreatic cancer in her mother; Stroke in an other family member. There is no history of Colon cancer or Esophageal cancer. Allergies  Allergen Reactions  . Ciprofloxacin     Upset Gi  . Codeine Nausea Only  . Sulfonamide Derivatives Nausea And Vomiting  . Penicillins Rash     Review of Systems  Constitutional: Negative for fever and chills.       Objective:   Physical Exam  Constitutional: She appears  well-developed and well-nourished.  Cardiovascular: Normal rate.   Pulmonary/Chest: Effort normal and breath sounds normal. No respiratory distress. She has no wheezes. She has no rales.  Musculoskeletal:  Right knee reveals full range of motion. No warmth. No erythema. No effusion. She has medial joint line tenderness.          Assessment & Plan:  Persistent right knee pain. Suspect osteoarthritis. We discussed risk and benefits of corticosteroid injection. We discussed limits on frequency of injection and explained if she's not responding to injection this time around we need to get x-rays and further evaluation. Right Knee prepped with Betadine.  Using sterile technique injected 1 cc of Depo-Medrol 2 cc of plain Xylocaine using 22-gauge one and one half inch needle using medial inferior approach. Patient tolerated well.

## 2013-12-07 ENCOUNTER — Encounter: Payer: Self-pay | Admitting: Family Medicine

## 2014-01-03 ENCOUNTER — Other Ambulatory Visit: Payer: Self-pay

## 2014-01-03 ENCOUNTER — Other Ambulatory Visit (INDEPENDENT_AMBULATORY_CARE_PROVIDER_SITE_OTHER): Payer: Commercial Managed Care - HMO

## 2014-01-03 DIAGNOSIS — Z0000 Encounter for general adult medical examination without abnormal findings: Secondary | ICD-10-CM

## 2014-01-03 LAB — CBC WITH DIFFERENTIAL/PLATELET
Basophils Absolute: 0 10*3/uL (ref 0.0–0.1)
Basophils Relative: 0.4 % (ref 0.0–3.0)
EOS PCT: 2.2 % (ref 0.0–5.0)
Eosinophils Absolute: 0.2 10*3/uL (ref 0.0–0.7)
HEMATOCRIT: 41.1 % (ref 36.0–46.0)
Hemoglobin: 13.2 g/dL (ref 12.0–15.0)
LYMPHS ABS: 3.4 10*3/uL (ref 0.7–4.0)
LYMPHS PCT: 39.9 % (ref 12.0–46.0)
MCHC: 32.2 g/dL (ref 30.0–36.0)
MCV: 86.8 fl (ref 78.0–100.0)
Monocytes Absolute: 0.5 10*3/uL (ref 0.1–1.0)
Monocytes Relative: 5.6 % (ref 3.0–12.0)
Neutro Abs: 4.4 10*3/uL (ref 1.4–7.7)
Neutrophils Relative %: 51.9 % (ref 43.0–77.0)
PLATELETS: 168 10*3/uL (ref 150.0–400.0)
RBC: 4.74 Mil/uL (ref 3.87–5.11)
RDW: 13.9 % (ref 11.5–15.5)
WBC: 8.4 10*3/uL (ref 4.0–10.5)

## 2014-01-03 LAB — POCT URINALYSIS DIPSTICK
BILIRUBIN UA: NEGATIVE
GLUCOSE UA: NEGATIVE
Leukocytes, UA: NEGATIVE
Nitrite, UA: NEGATIVE
RBC UA: NEGATIVE
SPEC GRAV UA: 1.015
Urobilinogen, UA: 2
pH, UA: 6.5

## 2014-01-03 LAB — BASIC METABOLIC PANEL
BUN: 15 mg/dL (ref 6–23)
CALCIUM: 8.5 mg/dL (ref 8.4–10.5)
CO2: 28 mEq/L (ref 19–32)
Chloride: 106 mEq/L (ref 96–112)
Creatinine, Ser: 0.7 mg/dL (ref 0.4–1.2)
GFR: 108.67 mL/min (ref >60.00)
Glucose, Bld: 77 mg/dL (ref 70–99)
POTASSIUM: 3.4 meq/L — AB (ref 3.5–5.1)
SODIUM: 138 meq/L (ref 135–145)

## 2014-01-03 LAB — HEPATIC FUNCTION PANEL
ALK PHOS: 42 U/L (ref 39–117)
ALT: 24 U/L (ref 0–35)
AST: 20 U/L (ref 0–37)
Albumin: 2.9 g/dL — ABNORMAL LOW (ref 3.5–5.2)
BILIRUBIN DIRECT: 0.1 mg/dL (ref 0.0–0.3)
BILIRUBIN TOTAL: 0.7 mg/dL (ref 0.2–1.2)
Total Protein: 6.5 g/dL (ref 6.0–8.3)

## 2014-01-03 LAB — LIPID PANEL
Cholesterol: 158 mg/dL (ref 0–200)
HDL: 61 mg/dL (ref >39.00)
LDL CALC: 87 mg/dL (ref 0–99)
NonHDL: 97
TRIGLYCERIDES: 52 mg/dL (ref 0.0–149.0)
Total CHOL/HDL Ratio: 3
VLDL: 10.4 mg/dL (ref 0.0–40.0)

## 2014-01-03 LAB — TSH: TSH: 0.42 u[IU]/mL (ref 0.35–4.50)

## 2014-01-03 MED ORDER — FLUTICASONE PROPIONATE 50 MCG/ACT NA SUSP: 2.0000 | Freq: Every day | NASAL | Status: DC

## 2014-01-10 ENCOUNTER — Ambulatory Visit (INDEPENDENT_AMBULATORY_CARE_PROVIDER_SITE_OTHER): Payer: Commercial Managed Care - HMO | Admitting: Family Medicine

## 2014-01-10 ENCOUNTER — Encounter: Payer: Self-pay | Admitting: Family Medicine

## 2014-01-10 VITALS — BP 128/80 | HR 67 | Temp 97.9°F | Ht 59.0 in | Wt 179.0 lb

## 2014-01-10 DIAGNOSIS — Z0000 Encounter for general adult medical examination without abnormal findings: Secondary | ICD-10-CM

## 2014-01-10 DIAGNOSIS — K219 Gastro-esophageal reflux disease without esophagitis: Secondary | ICD-10-CM

## 2014-01-10 DIAGNOSIS — E876 Hypokalemia: Secondary | ICD-10-CM

## 2014-01-10 DIAGNOSIS — Z23 Encounter for immunization: Secondary | ICD-10-CM

## 2014-01-10 DIAGNOSIS — I10 Essential (primary) hypertension: Secondary | ICD-10-CM

## 2014-01-10 DIAGNOSIS — E785 Hyperlipidemia, unspecified: Secondary | ICD-10-CM

## 2014-01-10 NOTE — Progress Notes (Signed)
Subjective:    Patient ID: Alison Garza, female    DOB: [birth year], 68 y.o.   MRN: [id number]  HPI Patient here for Medicare wellness exam and medical follow-up. Her chronic problems include history of obesity, hypertension, dyslipidemia, GERD, osteoarthritis. She's had some chronic leg edema which is controlled.  Colonoscopy up-to-date. Tetanus up-to-date. No history of shingles vaccine. No history of Prevnar 13. She's are had flu vaccine. Recent mammogram. She had previous hysterectomy for benign disease.  Medications are reviewed. Compliant with all.she remains on low-dose Estrace and his had many years of hot flashes when she has tried to discontinue. She refuses to discontinue this time. Blood pressure controlled with losartan. She takes furosemide 40 mg once daily. Also takes daily potassium supplement. GERD controlled with omeprazole.  Past Medical History  Diagnosis Date  . COLONIC POLYPS 05/29/2009  . HYPERLIPIDEMIA 02/29/2008  . GERD 05/29/2009  . OVERACTIVE BLADDER 02/29/2008  . MENOPAUSE, SURGICAL 02/29/2008  . Lumbago 03/12/2010  . Arthritis   . IBS (irritable bowel syndrome)   . Sciatic nerve pain     right  . Ruptured lumbar disc    Past Surgical History  Procedure Laterality Date  . Cholecystectomy  1984  . Kidney surgery  1980    to rotate kidney  . Colon surgery  2006    polyp removed  . Appendectomy  1973  . Ovarian cyst removal  2009  . Cesarean section      x 2  . Abdominal hysterectomy  1976    fiboids    reports that she quit smoking about 45 years ago. Her smoking use included Cigarettes. She smoked 0.00 packs per day. She has never used smokeless tobacco. She reports that she does not drink alcohol or use illicit drugs. family history includes Cerebral aneurysm (age of onset: 12) in her brother; Deep vein thrombosis in her daughter; Hyperlipidemia in her sister; Pancreatic cancer in her mother; Stroke in an other family member. There is no history of  Colon cancer or Esophageal cancer. Allergies  Allergen Reactions  . Ciprofloxacin     Upset Gi  . Codeine Nausea Only  . Sulfonamide Derivatives Nausea And Vomiting  . Penicillins Rash   1.  Risk factors based on Past Medical , Social, and Family history reviewed and as indicated above with no changes 2.  Limitations in physical activities None.  No recent falls. 3.  Depression/mood No active depression or anxiety issues 4.  Hearing No defiits 5.  ADLs independent in all. 6.  Cognitive function (orientation to time and place, language, writing, speech,memory) no short or long term memory issues.  Language and judgement intact. 7.  Home Safety no issues 8.  Height, weight, and visual acuity.all stable. 9.  Counseling discussed weight loss. Continue vitamin D supplementation. We've recommended increased intake of quality proteins with recent low albumin also increased dietary potassium. 10. Recommendation of preventive services.Prevnar 13. Flu vaccine already given 11. Labs based on risk factorsrecent labs reviewed with patient 12. Care Plan-as above 13. Other Providers-none regularly 14. Written schedule of screening/prevention services given to patient.    Review of Systems  Constitutional: Negative for fever, activity change, appetite change, fatigue and unexpected weight change.  HENT: Negative for ear pain, hearing loss, sore throat and trouble swallowing.   Eyes: Negative for visual disturbance.  Respiratory: Negative for cough and shortness of breath.   Cardiovascular: Positive for leg swelling. Negative for chest pain and palpitations.  Gastrointestinal: Negative for abdominal  pain, diarrhea, constipation and blood in stool.  Genitourinary: Negative for dysuria and hematuria.  Musculoskeletal: Positive for arthralgias. Negative for myalgias and back pain.  Skin: Negative for rash.  Neurological: Negative for dizziness, syncope and headaches.  Hematological: Negative for  adenopathy.  Psychiatric/Behavioral: Negative for confusion and dysphoric mood.       Objective:   Physical Exam  Constitutional: She is oriented to person, place, and time. She appears well-developed and well-nourished.  HENT:  Head: Normocephalic and atraumatic.  Eyes: EOM are normal. Pupils are equal, round, and reactive to light.  Neck: Normal range of motion. Neck supple. No thyromegaly present.  Cardiovascular: Normal rate, regular rhythm and normal heart sounds.   No murmur heard. Pulmonary/Chest: Breath sounds normal. No respiratory distress. She has no wheezes. She has no rales.  Abdominal: Soft. Bowel sounds are normal. She exhibits no distension and no mass. There is no tenderness. There is no rebound and no guarding.  Genitourinary:  Breasts are symmetric with no mass.  Musculoskeletal: Normal range of motion. She exhibits no edema.  Lymphadenopathy:    She has no cervical adenopathy.  Neurological: She is alert and oriented to person, place, and time. She displays normal reflexes. No cranial nerve deficit.  Skin: No rash noted.  Psychiatric: She has a normal mood and affect. Her behavior is normal. Judgment and thought content normal.          Assessment & Plan:  #1 health maintenance. Prevnar 13 given. Recent mammogram normal. Repeat tetanus in 2 years. She is getting every five-year colonoscopies #2 hypertension adequate control  #3 mild hypokalemia. Increase potassium rich diet. She is also not taking her potassium supplement regularly and is encouraged take this daily with chronic furosemide use. #4 low albumin. She does not have any proteinuria. We've discussed protein supplementation with high quality proteins. #5 hyperlipidemia. Continue Lipitor

## 2014-01-10 NOTE — Patient Instructions (Addendum)
High Protein Diet A high protein diet means that high protein foods are added to your diet. Getting more protein in the diet is important for a number of reasons. Protein helps the body to build tissue, muscle, and to repair damage. People who have had surgery, injuries such as broken bones, infections, and burns, or illnesses such as cancer, may need more protein in their diet.  SERVING SIZES Measuring foods and serving sizes helps to make sure you are getting the right amount of food. The list below tells how big or small some common serving sizes are.   1 oz.........4 stacked dice.  3 oz........Marland KitchenDeck of cards.  1 tsp.......Marland KitchenTip of little finger.  1 tbs......Marland KitchenMarland KitchenThumb.  2 tbs.......Marland KitchenGolf ball.   cup......Marland KitchenHalf of a fist.  1 cup.......Marland KitchenA fist. FOOD SOURCES OF PROTEIN Listed below are some food sources of protein and the amount of protein they contain. Your Registered Dietitian can calculate how many grams of protein you need for your medical condition. High protein foods can be added to the diet at mealtime or as snacks. Be sure to have at least 1 protein-containing food at each meal and snack to ensure adequate intake.  Meats and Meat Substitutes / Protein (g)  3 oz poultry (chicken, [place]) / 26 g  3 oz tuna, canned in water / 26 g  3 oz fish (cod) / 21 g  3 oz red meat (beef, pork) / 21 g  4 oz tofu / 9 g  1 egg / 6 g   cup egg substitute / 5 g  1 cup dried beans / 15 g  1 cup soy milk / 4 g Dairy / Protein (g)  1 cup milk (skim, 1%, 2%, whole) / 8 g   cup evaporated milk / 9 g  1 cup buttermilk / 8 g  1 cup low-fat plain yogurt / 11 g  1 cup regular plain yogurt / 9 g   cup cottage cheese / 14 g  1 oz cheddar cheese / 7 g Nuts / Protein (g)  2 tbs peanut butter / 8 g  1 oz peanuts / 7 g  2 tbs cashews / 5 g  2 tbs almonds / 5 g Document Released: 02/18/2005 Document Revised: 05/13/2011 Document Reviewed: 11/21/2006 ExitCare Patient Information  2015 [place], [place]. This information is not intended to replace advice given to you by your health care provider. Make sure you discuss any questions you have with your health care provider.  Continue yearly flu vaccine You will need tetanus booster in 2 years. Return for shingles vaccine at your convenience Consider yearly mammogram

## 2014-01-10 NOTE — Progress Notes (Signed)
Pre visit review using our clinic review tool, if applicable. No additional management support is needed unless otherwise documented below in the visit note. 

## 2014-01-21 IMAGING — CR DG KNEE 1-2V*L*
2 series · 2 of 2 positions shown · non-contrast
Comparison: None.

CLINICAL DATA: Pain

EXAM:
LEFT KNEE - 1-2 VIEW

[view not recorded (1 of 2)]
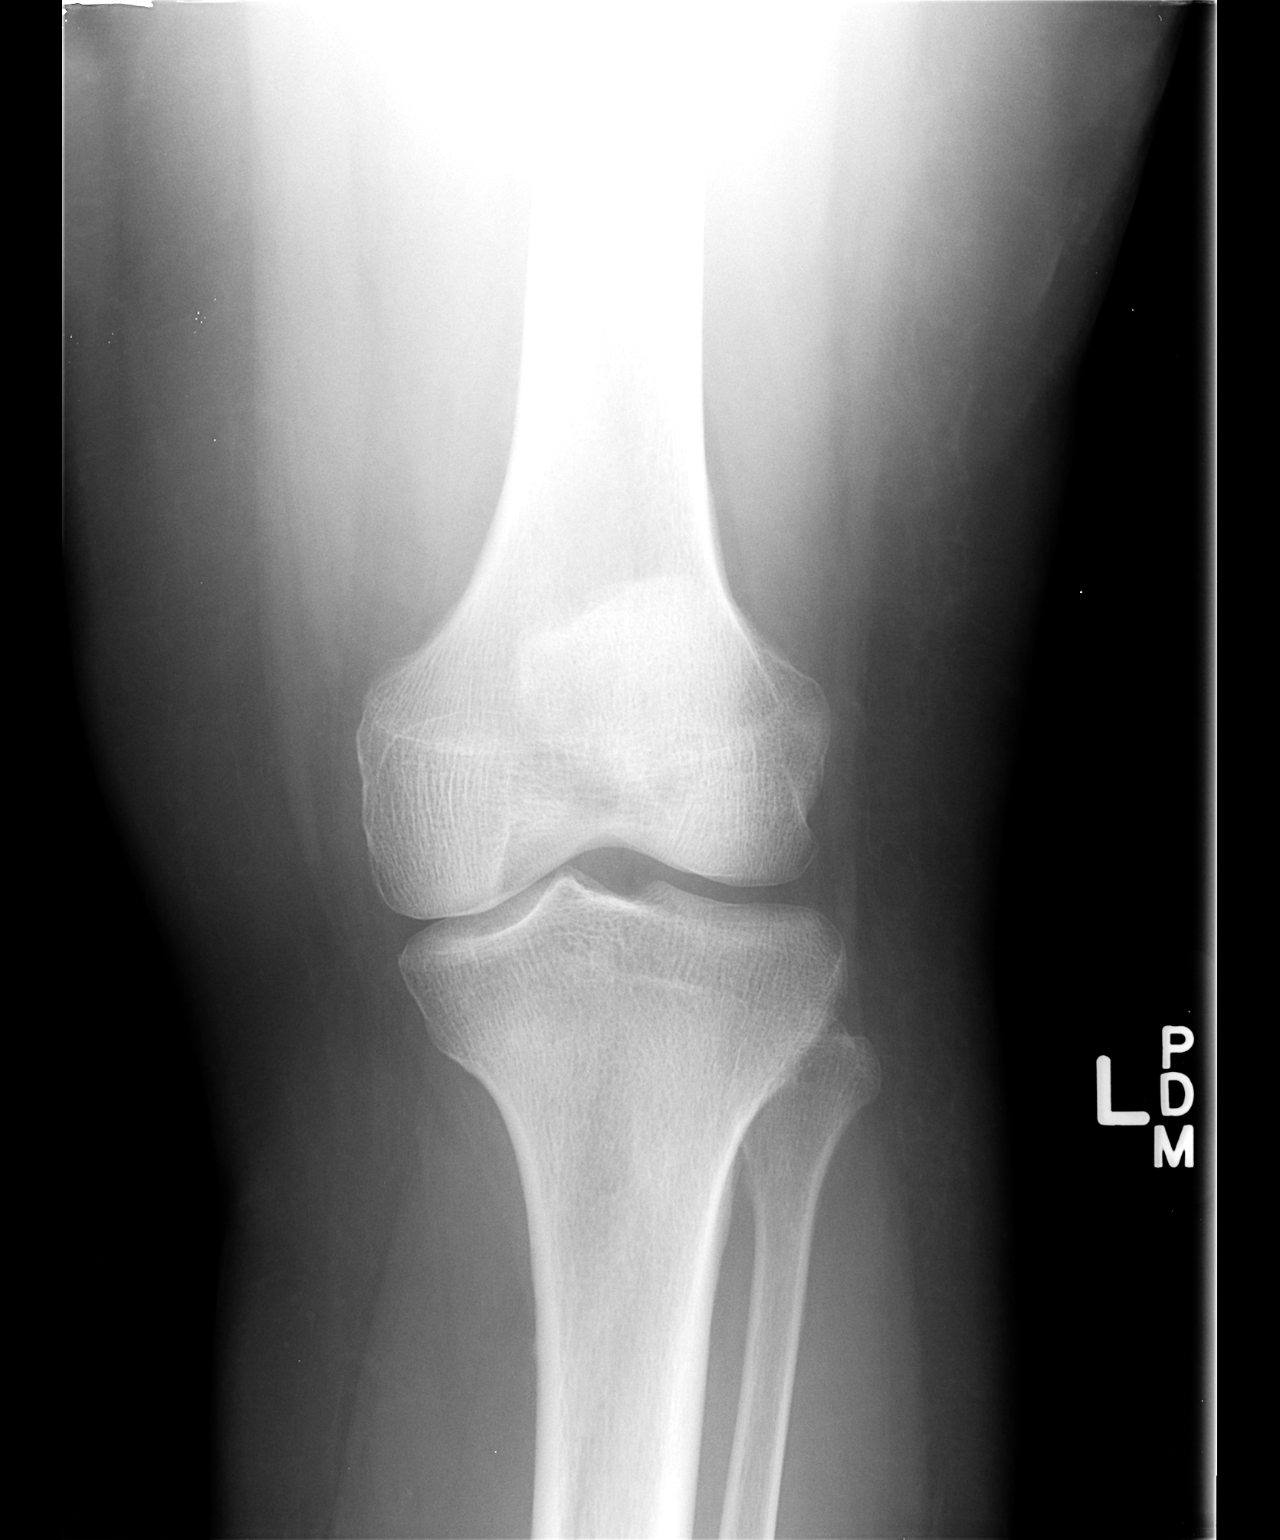

[view not recorded (2 of 2)]
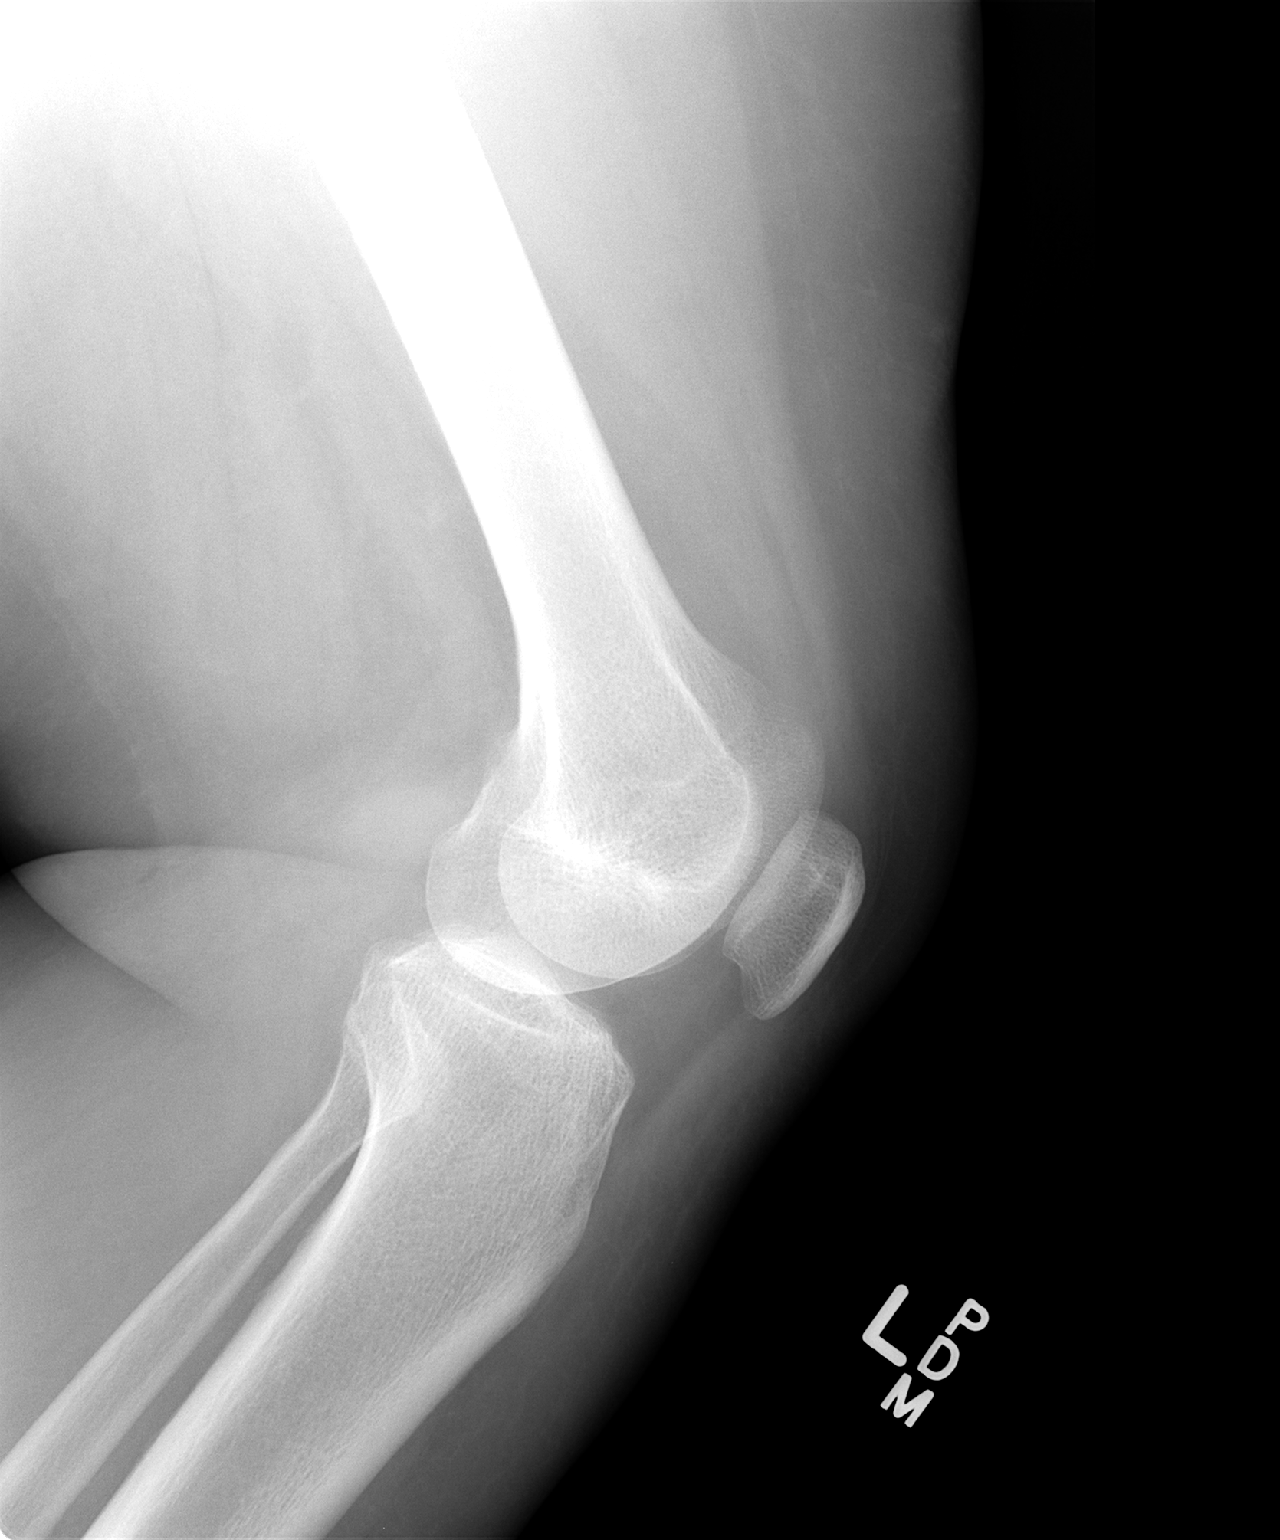

[2 of 2 positions shown; findings below may reference images not displayed]

FINDINGS: Frontal and lateral views were obtained. There is no fracture,
dislocation, or effusion. Joint spaces appear intact. No erosive
change.
IMPRESSION: No abnormality noted.

## 2014-02-01 ENCOUNTER — Ambulatory Visit (INDEPENDENT_AMBULATORY_CARE_PROVIDER_SITE_OTHER): Payer: Commercial Managed Care - HMO | Admitting: Family Medicine

## 2014-02-01 DIAGNOSIS — Z23 Encounter for immunization: Secondary | ICD-10-CM

## 2014-02-22 ENCOUNTER — Encounter: Payer: Self-pay | Admitting: Family Medicine

## 2014-02-22 ENCOUNTER — Ambulatory Visit (INDEPENDENT_AMBULATORY_CARE_PROVIDER_SITE_OTHER): Payer: Commercial Managed Care - HMO | Admitting: Family Medicine

## 2014-02-22 VITALS — BP 130/76 | HR 70 | Temp 97.8°F | Wt 181.0 lb

## 2014-02-22 DIAGNOSIS — M25561 Pain in right knee: Secondary | ICD-10-CM

## 2014-02-22 DIAGNOSIS — M25562 Pain in left knee: Secondary | ICD-10-CM

## 2014-02-22 MED ORDER — TRAMADOL HCL 50 MG PO TABS: 50.0000 mg | ORAL_TABLET | Freq: Four times a day (QID) | ORAL | Status: DC | PRN

## 2014-02-22 NOTE — Patient Instructions (Signed)
We will call you with Orthopedic referral.

## 2014-02-22 NOTE — Progress Notes (Signed)
Pre visit review using our clinic review tool, if applicable. No additional management support is needed unless otherwise documented below in the visit note. 

## 2014-02-22 NOTE — Progress Notes (Addendum)
   Subjective:    Patient ID: Alison Garza, female    DOB: 1945/10/15, 68 y.o.   MRN: [id number]  HPI Patient seen with bilateral knee pain. She's had multiple injections (steroid) in the past and has previously had laparoscopic surgery right knee. She presents with progressive bilateral knee pain over several months if not years. She's tried Tylenol without relief. She recently took some Aleve but had some edema. She has done some water aerobics but no consistent exercise. She thinks she may have had tramadol in the past but does not recall whether this helped. She notices occasional edema. No warmth or erythema. Denies any recent injury.  Past Medical History  Diagnosis Date  . COLONIC POLYPS 05/29/2009  . HYPERLIPIDEMIA 02/29/2008  . GERD 05/29/2009  . OVERACTIVE BLADDER 02/29/2008  . MENOPAUSE, SURGICAL 02/29/2008  . Lumbago 03/12/2010  . Arthritis   . IBS (irritable bowel syndrome)   . Sciatic nerve pain     right  . Ruptured lumbar disc    Past Surgical History  Procedure Laterality Date  . Cholecystectomy  1984  . Kidney surgery  1980    to rotate kidney  . Colon surgery  2006    polyp removed  . Appendectomy  1973  . Ovarian cyst removal  2009  . Cesarean section      x 2  . Abdominal hysterectomy  1976    fiboids    reports that she quit smoking about 46 years ago. Her smoking use included Cigarettes. She smoked 0.00 packs per day. She has never used smokeless tobacco. She reports that she does not drink alcohol or use illicit drugs. family history includes Cerebral aneurysm (age of onset: 9) in her brother; Deep vein thrombosis in her daughter; Hyperlipidemia in her sister; Pancreatic cancer in her mother; Stroke in an other family member. There is no history of Colon cancer or Esophageal cancer. Allergies  Allergen Reactions  . Ciprofloxacin     Upset Gi  . Codeine Nausea Only  . Sulfonamide Derivatives Nausea And Vomiting  . Penicillins Rash  '   Review of  Systems  Constitutional: Negative for fever and chills.  Musculoskeletal: Positive for arthralgias.  Skin: Negative for rash.  Neurological: Negative for weakness.       Objective:   Physical Exam  Cardiovascular: Normal rate and regular rhythm.   Pulmonary/Chest: Effort normal and breath sounds normal. No respiratory distress. She has no wheezes. She has no rales.  Musculoskeletal:  Good range of motion both knees. No effusion. No warmth. No erythema. Moderate medial joint line tenderness bilaterally          Assessment & Plan:  Bilateral knee pain. Suspect at least some component of degenerative arthritis. Set up orthopedic referral.  She has had steroid injections in past which provide temporary relief. We discussed other possible conservative therapies such as topical Diclofenac gel but she is not interested b/o cost.

## 2014-03-01 ENCOUNTER — Telehealth: Payer: Self-pay | Admitting: Family Medicine

## 2014-03-01 NOTE — Telephone Encounter (Signed)
FYI:  Patient wanted to let Dr. Elease Hashimoto know that she has never been to [facility].  She said Dr. Lorre Nick sent her file to the office but she never made an appointment.

## 2014-03-15 ENCOUNTER — Ambulatory Visit: Payer: Commercial Managed Care - HMO | Attending: Orthopaedic Surgery | Admitting: Physical Therapy

## 2014-03-15 DIAGNOSIS — M1990 Unspecified osteoarthritis, unspecified site: Secondary | ICD-10-CM | POA: Insufficient documentation

## 2014-03-15 DIAGNOSIS — M25561 Pain in right knee: Secondary | ICD-10-CM | POA: Diagnosis not present

## 2014-03-21 ENCOUNTER — Ambulatory Visit: Payer: Commercial Managed Care - HMO | Admitting: Physical Therapy

## 2014-03-22 ENCOUNTER — Ambulatory Visit: Payer: Commercial Managed Care - HMO | Admitting: Physical Therapy

## 2014-03-22 DIAGNOSIS — M25561 Pain in right knee: Secondary | ICD-10-CM | POA: Diagnosis not present

## 2014-03-24 ENCOUNTER — Ambulatory Visit: Payer: Commercial Managed Care - HMO | Admitting: Physical Therapy

## 2014-03-29 ENCOUNTER — Ambulatory Visit: Payer: Commercial Managed Care - HMO | Admitting: Physical Therapy

## 2014-03-29 DIAGNOSIS — M25561 Pain in right knee: Secondary | ICD-10-CM | POA: Diagnosis not present

## 2014-05-26 ENCOUNTER — Telehealth: Payer: Self-pay | Admitting: Family Medicine

## 2014-05-26 DIAGNOSIS — R600 Localized edema: Secondary | ICD-10-CM

## 2014-05-26 MED ORDER — POTASSIUM CHLORIDE CRYS ER 20 MEQ PO TBCR: 20.0000 meq | EXTENDED_RELEASE_TABLET | Freq: Every day | ORAL | Status: DC

## 2014-05-26 MED ORDER — FUROSEMIDE 40 MG PO TABS: 40.0000 mg | ORAL_TABLET | Freq: Every day | ORAL | Status: AC

## 2014-05-26 NOTE — Telephone Encounter (Signed)
Yes   Refills OK.   

## 2014-05-26 NOTE — Telephone Encounter (Signed)
RX sent to pharmacy. Pt is aware.

## 2014-05-26 NOTE — Telephone Encounter (Signed)
Pt request refill of the following: furosemide (LASIX) 40 MG tablet  Pt ask if Dr Elease Hashimoto would like for her to continue taking the 40 mg tablet.  She said she still has the fluid but not as bad     Phamacy: [facility]

## 2014-06-20 ENCOUNTER — Encounter: Payer: Self-pay | Admitting: Family Medicine

## 2014-06-20 ENCOUNTER — Ambulatory Visit (INDEPENDENT_AMBULATORY_CARE_PROVIDER_SITE_OTHER): Payer: Commercial Managed Care - HMO | Admitting: Family Medicine

## 2014-06-20 VITALS — BP 130/78 | HR 86 | Temp 97.8°F | Wt 183.0 lb

## 2014-06-20 DIAGNOSIS — M170 Bilateral primary osteoarthritis of knee: Secondary | ICD-10-CM

## 2014-06-20 DIAGNOSIS — M5416 Radiculopathy, lumbar region: Secondary | ICD-10-CM

## 2014-06-20 DIAGNOSIS — M25562 Pain in left knee: Secondary | ICD-10-CM

## 2014-06-20 MED ORDER — METHYLPREDNISOLONE ACETATE 40 MG/ML IJ SUSP
40.0000 mg | Freq: Once | INTRAMUSCULAR | Status: AC
  Administered 2014-06-20: 40 mg via INTRA_ARTICULAR

## 2014-06-20 NOTE — Patient Instructions (Signed)

## 2014-06-20 NOTE — Progress Notes (Signed)
   Subjective:    Patient ID: Alison Garza, female    DOB: 08/04/1945, 69 y.o.   MRN: [id number]  HPI Patient seen with both left knee pain as well as occasional pains radiating from her left hip region down her leg. She apparently seen a spine specialist over at [place] has had epidurals. We had MRI here back about 4 years ago which showed disc protrusion right S1-S2 level. She has not had any lower extremity numbness. No urine or stool incontinence. She's taken tramadol and Aleve without much relief. Pain is worse with position change. She's tried heat and ice without help. She is complaining of recurrent left knee pains. She's had steroid injections previously which have helped. Requesting the same today  Past Medical History  Diagnosis Date  . COLONIC POLYPS 05/29/2009  . HYPERLIPIDEMIA 02/29/2008  . GERD 05/29/2009  . OVERACTIVE BLADDER 02/29/2008  . MENOPAUSE, SURGICAL 02/29/2008  . Lumbago 03/12/2010  . Arthritis   . IBS (irritable bowel syndrome)   . Sciatic nerve pain     right  . Ruptured lumbar disc    Past Surgical History  Procedure Laterality Date  . Cholecystectomy  1984  . Kidney surgery  1980    to rotate kidney  . Colon surgery  2006    polyp removed  . Appendectomy  1973  . Ovarian cyst removal  2009  . Cesarean section      x 2  . Abdominal hysterectomy  1976    fiboids    reports that she quit smoking about 46 years ago. Her smoking use included Cigarettes. She has never used smokeless tobacco. She reports that she does not drink alcohol or use illicit drugs. family history includes Cerebral aneurysm (age of onset: 7) in her brother; Deep vein thrombosis in her daughter; Hyperlipidemia in her sister; Pancreatic cancer in her mother; Stroke in an other family member. There is no history of Colon cancer or Esophageal cancer. Allergies  Allergen Reactions  . Ciprofloxacin     Upset Gi  . Codeine Nausea Only  . Sulfonamide Derivatives Nausea And Vomiting   . Penicillins Rash      Review of Systems  Constitutional: Negative for fever, appetite change and unexpected weight change.  Cardiovascular: Negative for chest pain.  Musculoskeletal: Positive for back pain.  Neurological: Negative for weakness.       Objective:   Physical Exam  Constitutional: She appears well-developed and well-nourished.  Cardiovascular: Normal rate and regular rhythm.   Pulmonary/Chest: Effort normal and breath sounds normal. No respiratory distress. She has no wheezes. She has no rales.  Musculoskeletal:  Left knee full range of motion. No erythema. No warmth. No ecchymosis. Medial joint line tenderness. Straight leg raise negative  Neurological:  Symmetric reflexes knee and ankle bilaterally. Full-strength lower extremity          Assessment & Plan:  #1 osteoarthritis left knee. Patient requesting corticosteroid injection. We discussed risk and benefits. Patient consented. Prepped left knee with Betadine. Using sterile technique injected 1 mL Depo-Medrol 2 mL of plain Xylocaine using 25-gauge one and a half inch needle. We injected inferior and medial to inferior pole patella. Patient tolerated well. She will ice this tonight #2 left lumbar radiculitis symptoms. Nonfocal exam neurologically. She is encouraged to follow-up with her back specialist at [facility].

## 2014-06-20 NOTE — Progress Notes (Signed)
Pre visit review using our clinic review tool, if applicable. No additional management support is needed unless otherwise documented below in the visit note. 

## 2014-07-07 ENCOUNTER — Telehealth: Payer: Self-pay | Admitting: Family Medicine

## 2014-07-07 MED ORDER — ATORVASTATIN CALCIUM 10 MG PO TABS: 10.0000 mg | ORAL_TABLET | Freq: Every day | ORAL | Status: AC

## 2014-07-07 NOTE — Telephone Encounter (Signed)
Pt needs refill on atorvastatin 10 mg #90 w/[facility]

## 2014-07-07 NOTE — Telephone Encounter (Signed)
Rx sent to mail order

## 2014-09-09 ENCOUNTER — Other Ambulatory Visit: Payer: Self-pay | Admitting: Physician Assistant

## 2014-09-09 DIAGNOSIS — M545 Low back pain: Secondary | ICD-10-CM

## 2014-10-13 ENCOUNTER — Encounter: Payer: Self-pay | Admitting: Family Medicine

## 2014-10-13 ENCOUNTER — Ambulatory Visit (INDEPENDENT_AMBULATORY_CARE_PROVIDER_SITE_OTHER): Payer: Commercial Managed Care - HMO | Admitting: Family Medicine

## 2014-10-13 VITALS — BP 120/80 | HR 88 | Temp 98.6°F | Wt 181.0 lb

## 2014-10-13 DIAGNOSIS — R1031 Right lower quadrant pain: Secondary | ICD-10-CM

## 2014-10-13 DIAGNOSIS — R300 Dysuria: Secondary | ICD-10-CM

## 2014-10-13 DIAGNOSIS — R109 Unspecified abdominal pain: Secondary | ICD-10-CM

## 2014-10-13 LAB — POCT URINALYSIS DIPSTICK
Bilirubin, UA: NEGATIVE
Glucose, UA: NEGATIVE
Ketones, UA: NEGATIVE
Leukocytes, UA: NEGATIVE
Nitrite, UA: NEGATIVE
PH UA: 5
Protein, UA: NEGATIVE
RBC UA: NEGATIVE
Urobilinogen, UA: 0.2

## 2014-10-13 MED ORDER — CIPROFLOXACIN HCL 500 MG PO TABS: 500.0000 mg | ORAL_TABLET | Freq: Two times a day (BID) | ORAL | Status: DC

## 2014-10-13 NOTE — Patient Instructions (Signed)

## 2014-10-13 NOTE — Progress Notes (Signed)
Pre visit review using our clinic review tool, if applicable. No additional management support is needed unless otherwise documented below in the visit note. 

## 2014-10-13 NOTE — Progress Notes (Signed)
Subjective:    Patient ID: Alison Garza, female    DOB: [birth year], 69 y.o.   MRN: [id number]  HPI  Acute visit. Patient seen with somewhat poorly localized lower back and right lower quadrant abdominal pain. Her recent history is that she had epidural on August 1. Just yesterday she developed onset of constant achy pain right and left flank region. No urinary symptoms. She had a few chills but no documented fever. Her pain is both right and left flank. No clear exacerbating factors. She has some chronic constipation but states she had normal bowel movement just couple days ago. No bloody stools. No known injury. Her pain seems to radiate somewhat right lower abdomen. She's had multiple prior surgeries including total abdominal hysterectomy, cholecystectomy, appendectomy. She had recent cystoscopy per urology which was reportedly normal. She had colonoscopy 2012. No prior diverticular disease. She has had benign polyps. No recent appetite or weight changes.  Past Medical History  Diagnosis Date  . COLONIC POLYPS 05/29/2009  . HYPERLIPIDEMIA 02/29/2008  . GERD 05/29/2009  . OVERACTIVE BLADDER 02/29/2008  . MENOPAUSE, SURGICAL 02/29/2008  . Lumbago 03/12/2010  . Arthritis   . IBS (irritable bowel syndrome)   . Sciatic nerve pain     right  . Ruptured lumbar disc    Past Surgical History  Procedure Laterality Date  . Cholecystectomy  1984  . Kidney surgery  1980    to rotate kidney  . Colon surgery  2006    polyp removed  . Appendectomy  1973  . Ovarian cyst removal  2009  . Cesarean section      x 2  . Abdominal hysterectomy  1976    fiboids    reports that she quit smoking about 46 years ago. Her smoking use included Cigarettes. She has never used smokeless tobacco. She reports that she does not drink alcohol or use illicit drugs. family history includes Cerebral aneurysm (age of onset: 64) in her brother; Deep vein thrombosis in her daughter; Hyperlipidemia in her sister;  Pancreatic cancer in her mother; Stroke in an other family member. There is no history of Colon cancer or Esophageal cancer. Allergies  Allergen Reactions  . Codeine Nausea Only  . Sulfonamide Derivatives Nausea And Vomiting  . Penicillins Rash     Review of Systems  Constitutional: Negative for fever and chills.  Respiratory: Negative for cough and shortness of breath.   Cardiovascular: Negative for chest pain.  Gastrointestinal: Positive for abdominal pain. Negative for nausea, vomiting, diarrhea and blood in stool.  Genitourinary: Negative for dysuria and pelvic pain.  Musculoskeletal: Positive for back pain.  Neurological: Negative for dizziness and weakness.  Hematological: Negative for adenopathy.       Objective:   Physical Exam  Constitutional: She appears well-developed and well-nourished. No distress.  Cardiovascular: Normal rate and regular rhythm.   Pulmonary/Chest: Effort normal and breath sounds normal. No respiratory distress. She has no wheezes. She has no rales.  Abdominal: Soft. Bowel sounds are normal. She exhibits no distension and no mass. There is no rebound and no guarding.  Minimally tender right lower quadrant to deep palpation. No guarding or rebound  Musculoskeletal: She exhibits no edema.  No spinal tenderness. Minimal right and left flank tenderness  Skin: No rash noted.          Assessment & Plan:  Very poorly localized back and right lower quadrant abdominal pain. She does not have any fever to suggest active infection. No history of  diverticulosis or diverticulitis. We do not suspect this is related to epidural which was 11 days ago. Her pain is much higher and bilateral. She does have some chronic intermittent constipation but patient thinks this is not related .  Check CBC. Urinalysis here is normal. Follow-up promptly for any worsening pain, fever, bloody stools, or any other new symptoms

## 2014-10-14 ENCOUNTER — Telehealth: Payer: Self-pay | Admitting: Family Medicine

## 2014-10-14 LAB — CBC WITH DIFFERENTIAL/PLATELET
BASOS PCT: 1 % (ref 0.0–3.0)
Basophils Absolute: 0.1 10*3/uL (ref 0.0–0.1)
Eosinophils Absolute: 0.2 10*3/uL (ref 0.0–0.7)
Eosinophils Relative: 2.2 % (ref 0.0–5.0)
HCT: 44 % (ref 36.0–46.0)
Hemoglobin: 14.3 g/dL (ref 12.0–15.0)
LYMPHS PCT: 43 % (ref 12.0–46.0)
Lymphs Abs: 4.3 10*3/uL — ABNORMAL HIGH (ref 0.7–4.0)
MCHC: 32.5 g/dL (ref 30.0–36.0)
MCV: 86.2 fl (ref 78.0–100.0)
Monocytes Absolute: 0.5 10*3/uL (ref 0.1–1.0)
Monocytes Relative: 4.9 % (ref 3.0–12.0)
NEUTROS PCT: 48.9 % (ref 43.0–77.0)
Neutro Abs: 4.9 10*3/uL (ref 1.4–7.7)
PLATELETS: 204 10*3/uL (ref 150.0–400.0)
RBC: 5.11 Mil/uL (ref 3.87–5.11)
RDW: 14 % (ref 11.5–15.5)
WBC: 9.9 10*3/uL (ref 4.0–10.5)

## 2014-10-14 NOTE — Telephone Encounter (Signed)
Pt informed

## 2014-10-14 NOTE — Telephone Encounter (Signed)
Pt call to ask if her results were ready and if they are would like a call back

## 2014-10-19 ENCOUNTER — Telehealth: Payer: Self-pay | Admitting: Family Medicine

## 2014-10-19 NOTE — Telephone Encounter (Signed)
Pt informed

## 2014-10-19 NOTE — Telephone Encounter (Signed)
Pt call to ask for a call back she said she has questions about her lab work

## 2015-01-10 ENCOUNTER — Other Ambulatory Visit: Payer: Commercial Managed Care - HMO

## 2015-01-16 ENCOUNTER — Encounter: Payer: Commercial Managed Care - HMO | Admitting: Family Medicine

## 2015-08-21 ENCOUNTER — Encounter: Payer: Self-pay | Admitting: Internal Medicine

## 2016-04-22 ENCOUNTER — Ambulatory Visit: Payer: Self-pay | Admitting: Rheumatology

## 2016-04-30 DIAGNOSIS — Z8679 Personal history of other diseases of the circulatory system: Secondary | ICD-10-CM | POA: Insufficient documentation

## 2016-04-30 DIAGNOSIS — Z8719 Personal history of other diseases of the digestive system: Secondary | ICD-10-CM | POA: Insufficient documentation

## 2016-04-30 DIAGNOSIS — Z8639 Personal history of other endocrine, nutritional and metabolic disease: Secondary | ICD-10-CM | POA: Insufficient documentation

## 2016-04-30 DIAGNOSIS — M47816 Spondylosis without myelopathy or radiculopathy, lumbar region: Secondary | ICD-10-CM | POA: Insufficient documentation

## 2016-04-30 DIAGNOSIS — M2242 Chondromalacia patellae, left knee: Secondary | ICD-10-CM | POA: Insufficient documentation

## 2016-04-30 DIAGNOSIS — M19041 Primary osteoarthritis, right hand: Secondary | ICD-10-CM | POA: Insufficient documentation

## 2016-04-30 DIAGNOSIS — E559 Vitamin D deficiency, unspecified: Secondary | ICD-10-CM | POA: Insufficient documentation

## 2016-04-30 DIAGNOSIS — M19042 Primary osteoarthritis, left hand: Secondary | ICD-10-CM

## 2016-04-30 DIAGNOSIS — Z86010 Personal history of colonic polyps: Secondary | ICD-10-CM | POA: Insufficient documentation

## 2016-04-30 DIAGNOSIS — M170 Bilateral primary osteoarthritis of knee: Secondary | ICD-10-CM | POA: Insufficient documentation

## 2016-04-30 DIAGNOSIS — M2241 Chondromalacia patellae, right knee: Secondary | ICD-10-CM | POA: Insufficient documentation

## 2016-04-30 NOTE — Progress Notes (Signed)
Office Visit Note  Patient: Alison Garza             Date of Birth: 10/30/1945           MRN: [id number]             PCP: Pcp Not In System Referring: Eulas Post, MD Visit Date: 05/09/2016 Occupation: Retired CNA at [employer]    Subjective:  Knee pain.   History of Present Illness: Alison Garza is a 71 y.o. female with history of osteoarthritis and disc disease of lumbar spine. According to her she's been having increased pain in her knee joints recently. With the weather change the pain has increased. Otherwise overall she's been doing better. She has occasional swelling in her knee joints. None of the other joints have been swollen.  Activities of Daily Living:  Patient reports morning stiffness for 5 minutes.   Patient Reports nocturnal pain.  Difficulty dressing/grooming: Denies Difficulty climbing stairs: Denies Difficulty getting out of chair: Denies Difficulty using hands for taps, buttons, cutlery, and/or writing: Denies   Review of Systems  Constitutional: Negative for fatigue, night sweats, weight gain, weight loss and weakness.  HENT: Negative for mouth sores, trouble swallowing, trouble swallowing, mouth dryness and nose dryness.   Eyes: Negative for pain, redness, visual disturbance and dryness.  Respiratory: Negative for cough, shortness of breath and difficulty breathing.   Cardiovascular: Negative for chest pain, palpitations, hypertension, irregular heartbeat and swelling in legs/feet.  Gastrointestinal: Negative for blood in stool, constipation and diarrhea.  Endocrine: Negative for increased urination.  Genitourinary: Negative for vaginal dryness.  Musculoskeletal: Positive for arthralgias, joint pain and morning stiffness. Negative for joint swelling, myalgias, muscle weakness, muscle tenderness and myalgias.  Skin: Negative for color change, rash, hair loss, skin tightness, ulcers and sensitivity to sunlight.  Allergic/Immunologic:  Negative for susceptible to infections.  Neurological: Negative for dizziness, memory loss and night sweats.  Hematological: Negative for swollen glands.  Psychiatric/Behavioral: Negative for depressed mood and sleep disturbance. The patient is not nervous/anxious.     PMFS History:  Patient Active Problem List   Diagnosis Date Noted  . Spondylosis of lumbar region without myelopathy or radiculopathy 04/30/2016  . Primary osteoarthritis of both hands 04/30/2016  . Primary osteoarthritis of both knees 04/30/2016  . Chondromalacia patellae, left knee 04/30/2016  . Chondromalacia, patella, right 04/30/2016  . History of hypertension 04/30/2016  . History of colon polyps 04/30/2016  . History of hyperlipidemia 04/30/2016  . History of IBS 04/30/2016  . History of gastroesophageal reflux (GERD) 04/30/2016  . Vitamin D deficiency 04/30/2016  . Osteoarthritis of both knees 09/21/2013  . Sebaceous cyst of breast 07/20/2013  . Left leg pain 02/23/2013  . Obesity (BMI 30-39.9) 10/21/2012  . Hypertension 05/23/2011  . LEG PAIN 04/13/2010  . LUMBAGO 03/12/2010  . COLONIC POLYPS 05/29/2009  . GERD 05/29/2009  . Hyperlipidemia 02/29/2008  . OVERACTIVE BLADDER 02/29/2008  . MENOPAUSE, SURGICAL 02/29/2008  . WEIGHT GAIN 02/29/2008    Past Medical History:  Diagnosis Date  . Arthritis   . COLONIC POLYPS 05/29/2009  . GERD 05/29/2009  . HYPERLIPIDEMIA 02/29/2008  . IBS (irritable bowel syndrome)   . Lumbago 03/12/2010  . MENOPAUSE, SURGICAL 02/29/2008  . OVERACTIVE BLADDER 02/29/2008  . Ruptured lumbar disc   . Sciatic nerve pain    right    Family History  Problem Relation Age of Onset  . Pancreatic cancer Mother   . Hyperlipidemia Sister   .  Cerebral aneurysm Brother 17  . Deep vein thrombosis Daughter   . Stroke      maternal family  . Colon cancer Neg Hx   . Esophageal cancer Neg Hx    Past Surgical History:  Procedure Laterality Date  . ABDOMINAL HYSTERECTOMY  1976    fiboids  . APPENDECTOMY  1973  . CESAREAN SECTION     x 2  . CHOLECYSTECTOMY  1984  . COLON SURGERY  2006   polyp removed  . [place]   to rotate kidney  . OVARIAN CYST REMOVAL  2009   Social History   Social History Narrative  . No narrative on file     Objective: Vital Signs: BP 126/84   Pulse 84   Resp 14   Ht 4' 11.5" (1.511 m)   Wt 176 lb (79.8 kg)   BMI 34.95 kg/m    Physical Exam  Constitutional: She is oriented to person, place, and time. She appears well-developed and well-nourished.  HENT:  Head: Normocephalic and atraumatic.  Eyes: Conjunctivae and EOM are normal.  Neck: Normal range of motion.  Cardiovascular: Normal rate, regular rhythm, normal heart sounds and intact distal pulses.   Pulmonary/Chest: Effort normal and breath sounds normal.  Abdominal: Soft. Bowel sounds are normal.  Lymphadenopathy:    She has no cervical adenopathy.  Neurological: She is alert and oriented to person, place, and time.  Skin: Skin is warm and dry. Capillary refill takes less than 2 seconds.  Psychiatric: She has a normal mood and affect. Her behavior is normal.  Nursing note and vitals reviewed.    Musculoskeletal Exam: C-spine good range of motion she has limited painful range of motion of her lumbar spine. Shoulder joints elbow joints wrist joint MCPs PIPs DIPs with good range of motion. Hip joints knee joints ankles MTPs PIPs with good range of motion. She had some warmth on palpation of her right knee joint without any effusion. She had painful range of motion of bilateral knee joints.  CDAI Exam: No CDAI exam completed.    Investigation: No additional findings.    Imaging: Xr Knee 3 View Left  Result Date: 05/09/2016 Moderate to severe medial compartment narrowing was noted. Severe patellofemoral narrowing was noted. No chondrocalcinosis was noted. Impression: These findings were consistent with moderate to severe osteoarthritis and severe  chondromalacia patella  Xr Knee 3 View Right  Result Date: 05/09/2016 Moderate medial compartment narrowing with intercondylar osteophytes. No chondrocalcinosis was noted. Moderate patellofemoral narrowing was noted. Impression: Findings consistent with moderate osteoarthritis and moderate chondromalacia patella   Speciality Comments: No specialty comments available.    Procedures:  Large Joint Inj Date/Time: 05/09/2016 12:09 PM Performed by: Bo Merino Authorized by: Bo Merino   Consent Given by:  Patient Site marked: the procedure site was marked   Timeout: prior to procedure the correct patient, procedure, and site was verified   Indications:  Pain and joint swelling Location:  Knee Site:  R knee Prep: patient was prepped and draped in usual sterile fashion   Needle Size:  27 G Needle Length:  1.5 inches Approach:  Medial Ultrasound Guidance: No   Fluoroscopic Guidance: No   Arthrogram: No   Medications:  1.5 mL lidocaine 1 %; 40 mg triamcinolone acetonide 40 MG/ML Aspiration Attempted: Yes   Aspirate amount (mL):  0 Patient tolerance:  Patient tolerated the procedure well with no immediate complications Large Joint Inj Date/Time: 05/09/2016 12:09 PM Performed by: Bo Merino Authorized  by: Naseem Varden, [facility]   Consent Given by:  Patient Site marked: the procedure site was marked   Timeout: prior to procedure the correct patient, procedure, and site was verified   Indications:  Pain and joint swelling Location:  Knee Site:  L knee Prep: patient was prepped and draped in usual sterile fashion   Needle Size:  27 G Needle Length:  1.5 inches Approach:  Medial Ultrasound Guidance: No   Fluoroscopic Guidance: No   Arthrogram: No   Medications:  1.5 mL lidocaine 1 %; 40 mg triamcinolone acetonide 40 MG/ML Aspiration Attempted: Yes   Patient tolerance:  Patient tolerated the procedure well with no immediate complications   Allergies: Codeine;  Sulfonamide derivatives; and Penicillins   Assessment / Plan:     Visit Diagnoses: 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  Primary osteoarthritis of both hands: She is a stiffness and discomfort in her bilateral hands. Joint protection and muscle strengthening discussed.  Pain in bilateral knee joints : Different treatment options and their side effects were discussed. Patient wants to have cortisone injection to bilateral knee joints after informed consent was obtain bilateral knee joints were injected as described above. I'll also obtain x-ray of her bilateral knee joints to evaluate this further. The x-rays today showed moderate to severe osteoarthritis of her bilateral knee joints she has normal problems with mobility. She states the cortisone injections do not last very long. We had detailed discussion regarding weight loss diet and exercise and also Visco supplement injections. I will apply for Visco supplement injections to bilateral knee joints.  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  History of hypertension: Blood pressure is controlled  Rheumatoid factor positive - no synovitis on examination  History of colon polyps  History of hyperlipidemia  History of IBS  History of gastroesophageal reflux (GERD)  Vitamin D deficiency    Orders: Orders Placed This Encounter  Procedures  . Large Joint Injection/Arthrocentesis  . Large Joint Injection/Arthrocentesis  . XR KNEE 3 VIEW RIGHT  . XR KNEE 3 VIEW LEFT   No orders of the defined types were placed in this encounter.   Face-to-face time spent with patient was 30 minutes. 50% of time was spent in counseling and coordination of care.  Follow-Up Instructions: Return in about 6 months (around 11/09/2016) for Osteoarthritis.   Bo Merino, MD  Note  - This record has been created using Editor, commissioning.  Chart creation errors have been sought, but may not always  have been located. Such creation errors do not reflect on  the standard of medical care.

## 2016-05-09 ENCOUNTER — Telehealth: Payer: Self-pay | Admitting: Radiology

## 2016-05-09 ENCOUNTER — Ambulatory Visit (INDEPENDENT_AMBULATORY_CARE_PROVIDER_SITE_OTHER): Payer: Medicare HMO | Admitting: Rheumatology

## 2016-05-09 ENCOUNTER — Encounter: Payer: Self-pay | Admitting: Rheumatology

## 2016-05-09 ENCOUNTER — Ambulatory Visit (INDEPENDENT_AMBULATORY_CARE_PROVIDER_SITE_OTHER): Payer: Medicare HMO

## 2016-05-09 VITALS — BP 126/84 | HR 84 | Resp 14 | Ht 59.5 in | Wt 176.0 lb

## 2016-05-09 DIAGNOSIS — E559 Vitamin D deficiency, unspecified: Secondary | ICD-10-CM | POA: Diagnosis not present

## 2016-05-09 DIAGNOSIS — Z86010 Personal history of colonic polyps: Secondary | ICD-10-CM

## 2016-05-09 DIAGNOSIS — M19041 Primary osteoarthritis, right hand: Secondary | ICD-10-CM

## 2016-05-09 DIAGNOSIS — M47816 Spondylosis without myelopathy or radiculopathy, lumbar region: Secondary | ICD-10-CM | POA: Diagnosis not present

## 2016-05-09 DIAGNOSIS — Z8679 Personal history of other diseases of the circulatory system: Secondary | ICD-10-CM | POA: Diagnosis not present

## 2016-05-09 DIAGNOSIS — M25561 Pain in right knee: Secondary | ICD-10-CM

## 2016-05-09 DIAGNOSIS — M1712 Unilateral primary osteoarthritis, left knee: Secondary | ICD-10-CM

## 2016-05-09 DIAGNOSIS — M170 Bilateral primary osteoarthritis of knee: Secondary | ICD-10-CM | POA: Diagnosis not present

## 2016-05-09 DIAGNOSIS — M19042 Primary osteoarthritis, left hand: Secondary | ICD-10-CM

## 2016-05-09 DIAGNOSIS — R768 Other specified abnormal immunological findings in serum: Secondary | ICD-10-CM | POA: Diagnosis not present

## 2016-05-09 DIAGNOSIS — Z8639 Personal history of other endocrine, nutritional and metabolic disease: Secondary | ICD-10-CM | POA: Diagnosis not present

## 2016-05-09 DIAGNOSIS — M25562 Pain in left knee: Secondary | ICD-10-CM | POA: Diagnosis not present

## 2016-05-09 DIAGNOSIS — G8929 Other chronic pain: Secondary | ICD-10-CM

## 2016-05-09 DIAGNOSIS — Z8719 Personal history of other diseases of the digestive system: Secondary | ICD-10-CM

## 2016-05-09 DIAGNOSIS — M1711 Unilateral primary osteoarthritis, right knee: Secondary | ICD-10-CM

## 2016-05-09 MED ORDER — TRIAMCINOLONE ACETONIDE 40 MG/ML IJ SUSP
40.0000 mg | INTRAMUSCULAR | Status: AC | PRN
  Administered 2016-05-09: 40 mg via INTRA_ARTICULAR

## 2016-05-09 MED ORDER — LIDOCAINE HCL 1 % IJ SOLN
1.5000 mL | INTRAMUSCULAR | Status: AC | PRN
  Administered 2016-05-09: 1.5 mL

## 2016-05-09 NOTE — Telephone Encounter (Signed)
Apply for visco, per Dr Diona Fanti if possible, other if insurance prefers.    Patient aware this process takes time and states she will await call, but knows it will be a while

## 2016-05-09 NOTE — Patient Instructions (Signed)
Supplements for OA Natural anti-inflammatories  You can purchase these at [employer], [employer] or online.  . Turmeric (capsules)  . Ginger (ginger root or capsules)  . Omega 3 (Fish, flax seeds, chia seeds, walnuts, almonds)  . Tart cherry (dried or extract)   Patient should be under the care of a physician while taking these supplements. This may not be reproduced without the permission of Dr. Ceniyah Thorp.  

## 2016-05-20 ENCOUNTER — Telehealth: Payer: Self-pay | Admitting: Rheumatology

## 2016-05-20 NOTE — Telephone Encounter (Signed)
Patient states the authorization she received in the mail was for an echo for her PCP, for [place] to disregard the phone call she made to the office earlier.

## 2016-05-20 NOTE — Telephone Encounter (Signed)
Patient calling to see if the letter she received with authorization is for her visco supplementation. Advised patient would have Alison Garza look into it and give her a call back as soon as I find out.

## 2016-05-20 NOTE — Telephone Encounter (Signed)
Patient called stating she has some questions about her injections.  She received a letter from [facility]. [id number]#[phone].

## 2016-07-10 NOTE — Telephone Encounter (Signed)
In folder, pending patient purchase.

## 2016-07-19 ENCOUNTER — Telehealth (INDEPENDENT_AMBULATORY_CARE_PROVIDER_SITE_OTHER): Payer: Self-pay

## 2016-07-19 NOTE — Telephone Encounter (Signed)
Patient called and states the pharmacy had the wrong number on file that they got from our office. Both numbers in the patients chart are correct though, I verified with the patient. Just an fyi. Patient states she just provided [facility] with the correct phone number.

## 2016-07-19 NOTE — Telephone Encounter (Signed)
Noted. Thank You.

## 2016-07-19 NOTE — Telephone Encounter (Signed)
Talked with patient and advised patient to call [facility] to authorize Rx for Euflexxa to be delivered to our office.

## 2016-07-22 ENCOUNTER — Telehealth (INDEPENDENT_AMBULATORY_CARE_PROVIDER_SITE_OTHER): Payer: Self-pay | Admitting: Rheumatology

## 2016-07-22 NOTE — Telephone Encounter (Signed)
This is one you are already working on. Thanks.

## 2016-07-22 NOTE — Telephone Encounter (Signed)
Please call patient concerning a rx that was sent to [facility]

## 2016-07-22 NOTE — Telephone Encounter (Signed)
Talked with patient and advised patient to call [facility] to authorize Rx for Euflexxa to be delivered to our office.

## 2016-07-23 NOTE — Telephone Encounter (Signed)
Called and left voicemail advising patient to return call concerning a Rx.

## 2016-07-23 NOTE — Telephone Encounter (Signed)
Talked with patient concerning Rx, stated that this was an old message.

## 2016-07-23 NOTE — Telephone Encounter (Signed)
Please schedule patient for Bilateral Knee Injections for Euflexxa Buy and Bill   Patient is covered 80% by [facility] & 20% is Out of Pocket Patient has [employer] as secondary insurance which should pick up the 20% per Wendy May.  Approved for Euflexxa 20mg /2mL Approval [facility] [phone]  Thank You

## 2016-09-02 ENCOUNTER — Telehealth: Payer: Self-pay | Admitting: Rheumatology

## 2016-09-02 NOTE — Telephone Encounter (Signed)
Message sent in error

## 2016-09-27 ENCOUNTER — Telehealth: Payer: Self-pay | Admitting: Rheumatology

## 2016-09-27 NOTE — Telephone Encounter (Signed)
Patient advised there is no medication she needs to avoid.

## 2016-09-27 NOTE — Telephone Encounter (Signed)
Patient having Euflexxa injections starting next week. Patient wanted to know if there is any medicine restrictions with, or before injections. Please call to advise. (specifically Aleve/ Ibuprofen) Patient request a call by the end of day.

## 2016-10-02 ENCOUNTER — Ambulatory Visit (INDEPENDENT_AMBULATORY_CARE_PROVIDER_SITE_OTHER): Payer: Medicare HMO | Admitting: Rheumatology

## 2016-10-02 DIAGNOSIS — M1711 Unilateral primary osteoarthritis, right knee: Secondary | ICD-10-CM

## 2016-10-02 DIAGNOSIS — M1712 Unilateral primary osteoarthritis, left knee: Secondary | ICD-10-CM

## 2016-10-02 DIAGNOSIS — M170 Bilateral primary osteoarthritis of knee: Secondary | ICD-10-CM

## 2016-10-02 MED ORDER — LIDOCAINE HCL 2 % IJ SOLN
1.5000 mL | INTRAMUSCULAR | Status: AC | PRN
  Administered 2016-10-02: 1.5 mL

## 2016-10-02 MED ORDER — SODIUM HYALURONATE (VISCOSUP) 20 MG/2ML IX SOSY
20.0000 mg | PREFILLED_SYRINGE | INTRA_ARTICULAR | Status: AC | PRN
  Administered 2016-10-02: 20 mg via INTRA_ARTICULAR

## 2016-10-02 NOTE — Progress Notes (Signed)
   Procedure Note  Patient: Alison Garza             Date of Birth: [birth year]           MRN: [id number]             Visit Date: 10/02/2016  Procedures: Visit Diagnoses: No diagnosis found.  Euflexxa #1 Bilateral knees   Large Joint Inj Date/Time: 10/02/2016 11:03 AM Performed by: Bo Merino Authorized by: Bo Merino   Consent Given by:  Patient Site marked: the procedure site was marked   Timeout: prior to procedure the correct patient, procedure, and site was verified   Indications:  Pain Location:  Knee Site:  R knee Prep: patient was prepped and draped in usual sterile fashion   Needle Size:  27 G Needle Length:  1.5 inches Ultrasound Guidance: No   Fluoroscopic Guidance: No   Arthrogram: No   Medications:  1.5 mL lidocaine 2 %; 20 mg Sodium Hyaluronate 20 MG/2ML Aspiration Attempted: Yes   Patient tolerance:  Patient tolerated the procedure well with no immediate complications Large Joint Inj Date/Time: 10/02/2016 11:04 AM Performed by: Bo Merino Authorized by: Bo Merino   Consent Given by:  Patient Site marked: the procedure site was marked   Timeout: prior to procedure the correct patient, procedure, and site was verified   Indications:  Pain Location:  Knee Site:  L knee Prep: patient was prepped and draped in usual sterile fashion   Needle Size:  27 G Needle Length:  1.5 inches Ultrasound Guidance: No   Fluoroscopic Guidance: No   Arthrogram: No   Medications:  1.5 mL lidocaine 2 %; 20 mg Sodium Hyaluronate 20 MG/2ML Aspiration Attempted: Yes   Patient tolerance:  Patient tolerated the procedure well with no immediate complications   Bo Merino, MD

## 2016-10-03 NOTE — Progress Notes (Signed)
   Procedure Note  Patient: Alison Garza             Date of Birth: [birth year]           MRN: [id number]             Visit Date: 10/09/2016  Procedures: Visit Diagnoses: Primary osteoarthritis of both knees Euflexxa bilateral knees #2   Large Joint Inj Date/Time: 10/09/2016 9:43 AM Performed by: Bo Merino Authorized by: Bo Merino   Consent Given by:  Patient Site marked: the procedure site was marked   Timeout: prior to procedure the correct patient, procedure, and site was verified   Indications:  Pain and joint swelling Location:  Knee Site:  R knee Prep: patient was prepped and draped in usual sterile fashion   Needle Size:  27 G Needle Length:  1.5 inches Approach:  Medial Ultrasound Guidance: No   Fluoroscopic Guidance: No   Arthrogram: No   Medications:  1.5 mL lidocaine 1 %; 20 mg Sodium Hyaluronate 20 MG/2ML Aspiration Attempted: Yes   Aspirate amount (mL):  0 Patient tolerance:  Patient tolerated the procedure well with no immediate complications Large Joint Inj Date/Time: 10/09/2016 9:44 AM Performed by: Bo Merino Authorized by: Bo Merino   Consent Given by:  Patient Site marked: the procedure site was marked   Timeout: prior to procedure the correct patient, procedure, and site was verified   Indications:  Pain and joint swelling Location:  Knee Site:  L knee Prep: patient was prepped and draped in usual sterile fashion   Needle Size:  27 G Needle Length:  1.5 inches Approach:  Medial Ultrasound Guidance: No   Fluoroscopic Guidance: No   Arthrogram: No   Medications:  1.5 mL lidocaine 1 %; 20 mg Sodium Hyaluronate 20 MG/2ML Aspiration Attempted: Yes   Aspirate amount (mL):  0 Patient tolerance:  Patient tolerated the procedure well with no immediate complications   Bo Merino, MD

## 2016-10-09 ENCOUNTER — Ambulatory Visit (INDEPENDENT_AMBULATORY_CARE_PROVIDER_SITE_OTHER): Payer: Medicare HMO | Admitting: Rheumatology

## 2016-10-09 ENCOUNTER — Encounter: Payer: Self-pay | Admitting: Rheumatology

## 2016-10-09 ENCOUNTER — Ambulatory Visit: Payer: Medicare HMO | Admitting: Rheumatology

## 2016-10-09 DIAGNOSIS — M1711 Unilateral primary osteoarthritis, right knee: Secondary | ICD-10-CM

## 2016-10-09 DIAGNOSIS — M170 Bilateral primary osteoarthritis of knee: Secondary | ICD-10-CM

## 2016-10-09 DIAGNOSIS — M1712 Unilateral primary osteoarthritis, left knee: Secondary | ICD-10-CM

## 2016-10-09 MED ORDER — SODIUM HYALURONATE (VISCOSUP) 20 MG/2ML IX SOSY
20.0000 mg | PREFILLED_SYRINGE | INTRA_ARTICULAR | Status: AC | PRN
  Administered 2016-10-09: 20 mg via INTRA_ARTICULAR

## 2016-10-09 MED ORDER — LIDOCAINE HCL 1 % IJ SOLN
1.5000 mL | INTRAMUSCULAR | Status: AC | PRN
  Administered 2016-10-09: 1.5 mL

## 2016-10-09 NOTE — Progress Notes (Signed)
   Procedure Note  Patient: Alison Garza             Date of Birth: 1945/09/06           MRN: [id number]             Visit Date: 10/16/2016  Procedures: Visit Diagnoses: Primary osteoarthritis of both knees Bilateral Knees Euflexxa #3   Large Joint Inj Date/Time: 10/16/2016 12:07 PM Performed by: Bo Merino Authorized by: Bo Merino   Consent Given by:  Patient Site marked: the procedure site was marked   Timeout: prior to procedure the correct patient, procedure, and site was verified   Indications:  Pain and joint swelling Location:  Knee Site:  R knee Prep: patient was prepped and draped in usual sterile fashion   Needle Size:  27 G Needle Length:  1.5 inches Approach:  Medial Ultrasound Guidance: No   Fluoroscopic Guidance: No   Arthrogram: No   Medications:  1.5 mL lidocaine 1 %; 20 mg Sodium Hyaluronate 20 MG/2ML Aspiration Attempted: Yes   Aspirate amount (mL):  0 Patient tolerance:  Patient tolerated the procedure well with no immediate complications  Large Joint Inj Date/Time: 10/16/2016 12:09 PM Performed by: Bo Merino Authorized by: Bo Merino   Consent Given by:  Patient Site marked: the procedure site was marked   Timeout: prior to procedure the correct patient, procedure, and site was verified   Indications:  Pain and joint swelling Location:  Knee Site:  L knee Prep: patient was prepped and draped in usual sterile fashion   Needle Size:  27 G Needle Length:  1.5 inches Approach:  Medial Ultrasound Guidance: No   Fluoroscopic Guidance: No   Arthrogram: No   Medications:  1.5 mL lidocaine 1 %; 20 mg Sodium Hyaluronate 20 MG/2ML Aspiration Attempted: Yes   Aspirate amount (mL):  0 Patient tolerance:  Patient tolerated the procedure well with no immediate complications   Bo Merino, MD

## 2016-10-16 ENCOUNTER — Telehealth: Payer: Self-pay | Admitting: Rheumatology

## 2016-10-16 ENCOUNTER — Ambulatory Visit: Payer: Medicare HMO | Admitting: Rheumatology

## 2016-10-16 ENCOUNTER — Ambulatory Visit (INDEPENDENT_AMBULATORY_CARE_PROVIDER_SITE_OTHER): Payer: Medicare HMO | Admitting: Rheumatology

## 2016-10-16 DIAGNOSIS — M1711 Unilateral primary osteoarthritis, right knee: Secondary | ICD-10-CM | POA: Diagnosis not present

## 2016-10-16 DIAGNOSIS — M1712 Unilateral primary osteoarthritis, left knee: Secondary | ICD-10-CM | POA: Diagnosis not present

## 2016-10-16 DIAGNOSIS — M170 Bilateral primary osteoarthritis of knee: Secondary | ICD-10-CM

## 2016-10-16 MED ORDER — LIDOCAINE HCL 1 % IJ SOLN
1.5000 mL | INTRAMUSCULAR | Status: AC | PRN
  Administered 2016-10-16: 1.5 mL

## 2016-10-16 MED ORDER — SODIUM HYALURONATE (VISCOSUP) 20 MG/2ML IX SOSY
20.0000 mg | PREFILLED_SYRINGE | INTRA_ARTICULAR | Status: AC | PRN
  Administered 2016-10-16: 20 mg via INTRA_ARTICULAR

## 2016-10-16 NOTE — Telephone Encounter (Signed)
Patient would like to know when can she return to the Gym post knee injections. Patient usually uses the stationary bike at the gym. Please call to advise.

## 2016-10-17 NOTE — Telephone Encounter (Signed)
Called patient, per Dr Merrilee Jansky  to go avoid [place] and if knee hurts stop. She has voiced understanding. States she will go again next week bicycle only, nothing else, she states this normally does not hurt her knee

## 2016-11-11 ENCOUNTER — Ambulatory Visit: Payer: Medicare HMO | Admitting: Rheumatology

## 2016-11-12 NOTE — Progress Notes (Signed)
Office Visit Note  Patient: Alison Garza             Date of Birth: 07/19/1945           MRN: [id number]             PCP: Colonel Bald, MD Referring: No ref. provider found Visit Date: 11/21/2016 Occupation: @GUAROCC @    Subjective:  Right knee pain.   History of Present Illness: Alison Garza is a 71 y.o. female with history of osteoarthritis and disc disease. She states she's been having increased pain in her right knee joint. She denies any history of injury. She has some arthritis in her hands which is not bothering her much currently. She she had cortisone injections to her lower back and is doing better.  Activities of Daily Living:  Patient reports morning stiffness for 2  minute.   Patient Denies nocturnal pain.  Difficulty dressing/grooming: Denies Difficulty climbing stairs: Denies Difficulty getting out of chair: Denies Difficulty using hands for taps, buttons, cutlery, and/or writing: Denies   Review of Systems  Constitutional: Negative.  Negative for fatigue.  HENT: Negative for mouth dryness.   Eyes: Positive for dryness.  Respiratory: Negative.  Negative for cough, shortness of breath and difficulty breathing.   Cardiovascular: Negative.  Negative for palpitations, hypertension and irregular heartbeat.  Gastrointestinal: Positive for constipation. Negative for blood in stool and diarrhea.  Endocrine: Negative.   Genitourinary: Negative.  Negative for nocturia.  Musculoskeletal: Negative for joint swelling, myalgias, muscle weakness, morning stiffness and myalgias.  Skin: Negative.  Negative for color change, rash, hair loss, ulcers and sensitivity to sunlight.  Neurological: Negative.  Negative for headaches.  Hematological: Negative.  Negative for swollen glands.  Psychiatric/Behavioral: Negative.  Negative for depressed mood and sleep disturbance. The patient is not nervous/anxious.     PMFS History:  Patient Active Problem List   Diagnosis  Date Noted  . Spondylosis of lumbar region without myelopathy or radiculopathy 04/30/2016  . Primary osteoarthritis of both hands 04/30/2016  . Primary osteoarthritis of both knees 04/30/2016  . Chondromalacia patellae, left knee 04/30/2016  . Chondromalacia, patella, right 04/30/2016  . History of hypertension 04/30/2016  . History of colon polyps 04/30/2016  . History of hyperlipidemia 04/30/2016  . History of IBS 04/30/2016  . History of gastroesophageal reflux (GERD) 04/30/2016  . Vitamin D deficiency 04/30/2016  . Osteoarthritis of both knees 09/21/2013  . Sebaceous cyst of breast 07/20/2013  . Left leg pain 02/23/2013  . Obesity (BMI 30-39.9) 10/21/2012  . Hypertension 05/23/2011  . LEG PAIN 04/13/2010  . LUMBAGO 03/12/2010  . COLONIC POLYPS 05/29/2009  . GERD 05/29/2009  . Hyperlipidemia 02/29/2008  . OVERACTIVE BLADDER 02/29/2008  . MENOPAUSE, SURGICAL 02/29/2008  . WEIGHT GAIN 02/29/2008    Past Medical History:  Diagnosis Date  . Arthritis   . COLONIC POLYPS 05/29/2009  . GERD 05/29/2009  . HYPERLIPIDEMIA 02/29/2008  . IBS (irritable bowel syndrome)   . Lumbago 03/12/2010  . MENOPAUSE, SURGICAL 02/29/2008  . OVERACTIVE BLADDER 02/29/2008  . Ruptured lumbar disc   . Sciatic nerve pain    right    Family History  Problem Relation Age of Onset  . Pancreatic cancer Mother   . Hyperlipidemia Sister   . Cerebral aneurysm Brother 66  . Deep vein thrombosis Daughter   . Stroke Unknown        maternal family  . Colon cancer Neg Hx   . Esophageal cancer Neg Hx  Past Surgical History:  Procedure Laterality Date  . ABDOMINAL HYSTERECTOMY  1976   fiboids  . APPENDECTOMY  1973  . CESAREAN SECTION     x 2  . CHOLECYSTECTOMY  1984  . COLON SURGERY  2006   polyp removed  . [place]   to rotate kidney  . OVARIAN CYST REMOVAL  2009   Social History   Social History Narrative  . No narrative on file     Objective: Vital Signs: BP (!) 142/97    Pulse 88   Resp 14   Ht 5' (1.524 m)   Wt 171 lb (77.6 kg)   BMI 33.40 kg/m    Physical Exam  Constitutional: She is oriented to person, place, and time. She appears well-developed and well-nourished.  HENT:  Head: Normocephalic and atraumatic.  Eyes: Conjunctivae and EOM are normal.  Neck: Normal range of motion.  Cardiovascular: Normal rate, regular rhythm, normal heart sounds and intact distal pulses.   Pulmonary/Chest: Effort normal and breath sounds normal.  Abdominal: Soft. Bowel sounds are normal.  Lymphadenopathy:    She has no cervical adenopathy.  Neurological: She is alert and oriented to person, place, and time.  Skin: Skin is warm and dry. Capillary refill takes less than 2 seconds.  Psychiatric: She has a normal mood and affect. Her behavior is normal.  Nursing note and vitals reviewed.    Musculoskeletal Exam: C-spine and thoracic lumbar spine good range of motion. She is some thoracic kyphosis. Shoulder joints elbow joints are good range of motion. She has no synovitis in her hands. She has DIP PIP thickening. Hip joints are good range of motion. She is painful range of motion of her right knee joint without any warmth swelling or effusion. Left knee joint was good range of motion with no warmth swelling or effusion.  CDAI Exam: No CDAI exam completed.    Investigation: No additional findings.   Imaging: Xr Knee 3 View Right  Result Date: 11/21/2016 Moderate lateral compartment narrowing and intercondylar osteophytes were noted. Moderate to severe patellofemoral narrowing was noted. No chondrocalcinosis was noted. 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   Speciality Comments: No specialty comments available.    Procedures:  No procedures performed Allergies: Codeine; Sulfonamide derivatives; and Penicillins   Assessment / Plan:     Visit Diagnoses: Primary osteoarthritis of  both hands: Joint protection and muscle strengthening discussed.  Chronic pain of right knee. She's been having increase right knee joint pain. No warmth swelling or effusion was noted. - Plan: XR KNEE 3 VIEW RIGHT. 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  Primary osteoarthritis of both knees - With chondromalacia patella: She is trying weight loss and exercise.  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  Rheumatoid factor positive: She has no synovitis on examination.  Vitamin D deficiency: She is on supplement.  History of obesity: She is trying weight loss. Diet and exercise was discussed today.  History of hypertension: Her blood pressure is elevated. Have advised her to monitor blood pressure closely.  History of colon polyps  History of hyperlipidemia  History of IBS  History of gastroesophageal reflux (GERD)    Orders: Orders Placed This Encounter  Procedures  . XR KNEE 3 VIEW RIGHT   No orders of the defined types were placed in this encounter.  Follow-Up Instructions: Return in about 1 year (around 11/21/2017) for OA DDD.   Bo Merino, MD  Note - This record has been created using Editor, commissioning.  Chart creation errors have been sought, but may not always  have been located. Such creation errors do not reflect on  the standard of medical care.

## 2016-11-21 ENCOUNTER — Encounter: Payer: Self-pay | Admitting: Rheumatology

## 2016-11-21 ENCOUNTER — Ambulatory Visit (INDEPENDENT_AMBULATORY_CARE_PROVIDER_SITE_OTHER): Payer: Medicare HMO | Admitting: Rheumatology

## 2016-11-21 ENCOUNTER — Ambulatory Visit (INDEPENDENT_AMBULATORY_CARE_PROVIDER_SITE_OTHER): Payer: Medicare HMO

## 2016-11-21 VITALS — BP 142/97 | HR 88 | Resp 14 | Ht 60.0 in | Wt 171.0 lb

## 2016-11-21 DIAGNOSIS — M170 Bilateral primary osteoarthritis of knee: Secondary | ICD-10-CM

## 2016-11-21 DIAGNOSIS — M25561 Pain in right knee: Secondary | ICD-10-CM

## 2016-11-21 DIAGNOSIS — E559 Vitamin D deficiency, unspecified: Secondary | ICD-10-CM

## 2016-11-21 DIAGNOSIS — G8929 Other chronic pain: Secondary | ICD-10-CM

## 2016-11-21 DIAGNOSIS — M19041 Primary osteoarthritis, right hand: Secondary | ICD-10-CM

## 2016-11-21 DIAGNOSIS — Z860100 Personal history of colon polyps, unspecified: Secondary | ICD-10-CM

## 2016-11-21 DIAGNOSIS — Z8639 Personal history of other endocrine, nutritional and metabolic disease: Secondary | ICD-10-CM

## 2016-11-21 DIAGNOSIS — M47816 Spondylosis without myelopathy or radiculopathy, lumbar region: Secondary | ICD-10-CM | POA: Diagnosis not present

## 2016-11-21 DIAGNOSIS — Z8719 Personal history of other diseases of the digestive system: Secondary | ICD-10-CM

## 2016-11-21 DIAGNOSIS — R768 Other specified abnormal immunological findings in serum: Secondary | ICD-10-CM

## 2016-11-21 DIAGNOSIS — Z8679 Personal history of other diseases of the circulatory system: Secondary | ICD-10-CM

## 2016-11-21 DIAGNOSIS — M19042 Primary osteoarthritis, left hand: Secondary | ICD-10-CM

## 2017-03-07 NOTE — Progress Notes (Signed)
Office Visit Note  Patient: Alison Garza             Date of Birth: [birth year]           MRN: [id number]             PCP: Colonel Bald, MD Referring: Colonel Bald, MD Visit Date: 03/20/2017 Occupation: @GUAROCC @    Subjective:  Right knee pain   History of Present Illness: Alison Garza is a 72 y.o. female with history of osteoarthritis.  Patient states she has been having increased pain in her right knee for the past 1 month. She denies any injury or fall. She states she had Euflexa injections in bilateral knees in August 2018.  She previously had cortisone injections as well.  Her left knee has caused no discomfort since.  She states her right knee swells occasionally.  She denies any hand pain or lower back pain at this time.      Activities of Daily Living:  Patient reports morning stiffness for 2 minutes.   Patient Denies nocturnal pain.  Difficulty dressing/grooming: Denies Difficulty climbing stairs: Reports Difficulty getting out of chair: Reports Difficulty using hands for taps, buttons, cutlery, and/or writing: Denies   Review of Systems  Constitutional: Negative for fatigue and weakness.  HENT: Negative for mouth sores, mouth dryness and nose dryness.   Eyes: Positive for dryness. Negative for redness and visual disturbance.  Respiratory: Negative for cough, hemoptysis, shortness of breath and difficulty breathing.   Cardiovascular: Negative for chest pain, palpitations, hypertension, irregular heartbeat and swelling in legs/feet.  Gastrointestinal: Positive for constipation. Negative for blood in stool and diarrhea.  Endocrine: Negative for increased urination.  Genitourinary: Negative for painful urination.  Musculoskeletal: Positive for arthralgias, joint pain and morning stiffness. Negative for joint swelling, myalgias, muscle weakness, muscle tenderness and myalgias.  Skin: Negative for color change, pallor, rash, hair loss, nodules/bumps,  redness, ulcers and sensitivity to sunlight.  Neurological: Negative for dizziness, numbness and headaches.  Hematological: Negative for swollen glands.  Psychiatric/Behavioral: Negative for depressed mood and sleep disturbance. The patient is not nervous/anxious.     PMFS History:  Patient Active Problem List   Diagnosis Date Noted  . Spondylosis of lumbar region without myelopathy or radiculopathy 04/30/2016  . Primary osteoarthritis of both hands 04/30/2016  . Primary osteoarthritis of both knees 04/30/2016  . Chondromalacia patellae, left knee 04/30/2016  . Chondromalacia, patella, right 04/30/2016  . History of hypertension 04/30/2016  . History of colon polyps 04/30/2016  . History of hyperlipidemia 04/30/2016  . History of IBS 04/30/2016  . History of gastroesophageal reflux (GERD) 04/30/2016  . Vitamin D deficiency 04/30/2016  . Osteoarthritis of both knees 09/21/2013  . Sebaceous cyst of breast 07/20/2013  . Left leg pain 02/23/2013  . Obesity (BMI 30-39.9) 10/21/2012  . Hypertension 05/23/2011  . LEG PAIN 04/13/2010  . LUMBAGO 03/12/2010  . COLONIC POLYPS 05/29/2009  . GERD 05/29/2009  . Hyperlipidemia 02/29/2008  . OVERACTIVE BLADDER 02/29/2008  . MENOPAUSE, SURGICAL 02/29/2008  . WEIGHT GAIN 02/29/2008    Past Medical History:  Diagnosis Date  . Arthritis   . COLONIC POLYPS 05/29/2009  . GERD 05/29/2009  . HYPERLIPIDEMIA 02/29/2008  . IBS (irritable bowel syndrome)   . Lumbago 03/12/2010  . MENOPAUSE, SURGICAL 02/29/2008  . OVERACTIVE BLADDER 02/29/2008  . Ruptured lumbar disc   . Sciatic nerve pain    right    Family History  Problem Relation Age of Onset  .  Pancreatic cancer Mother   . Hyperlipidemia Sister   . Cerebral aneurysm Brother 50  . Deep vein thrombosis Daughter   . Stroke Unknown        maternal family  . Colon cancer Neg Hx   . Esophageal cancer Neg Hx    Past Surgical History:  Procedure Laterality Date  . ABDOMINAL HYSTERECTOMY   1976   fiboids  . APPENDECTOMY  1973  . CESAREAN SECTION     x 2  . CHOLECYSTECTOMY  1984  . COLON SURGERY  2006   polyp removed  . [place]   to rotate kidney  . OVARIAN CYST REMOVAL  2009   Social History   Social History Narrative  . Not on file     Objective: Vital Signs: BP 128/76 (BP Location: Left Arm, Patient Position: Sitting, Cuff Size: Normal)   Pulse 75   Resp 15   Ht 4' 11.5" (1.511 m)   Wt 178 lb (80.7 kg)   BMI 35.35 kg/m    Physical Exam  Constitutional: She is oriented to person, place, and time. She appears well-developed and well-nourished.  HENT:  Head: Normocephalic and atraumatic.  Eyes: Conjunctivae and EOM are normal.  Neck: Normal range of motion.  Cardiovascular: Normal rate, regular rhythm, normal heart sounds and intact distal pulses.  Pulmonary/Chest: Effort normal and breath sounds normal.  Abdominal: Soft. Bowel sounds are normal.  Lymphadenopathy:    She has no cervical adenopathy.  Neurological: She is alert and oriented to person, place, and time.  Skin: Skin is warm and dry. Capillary refill takes less than 2 seconds.  Psychiatric: She has a normal mood and affect. Her behavior is normal.  Nursing note and vitals reviewed.    Musculoskeletal Exam: C-spine, thoracic spine, and lumbar spine good ROM.  Mild kyphosis.  No midline spinal tenderness.  Shoulder joints, elbow joints, wrist joints, MCPs, PIPs, and DIPs good ROM with no synovitis.  PIP and DIP synovial thickening.  Hip joints good ROM.  No tenderness of bilateral trochanteric bursa.  Right knee discomfort with ROM.  No warmth or effusion.  Left knee good ROM with no effusion or warmth.  Ankles, MTPs, PIPs, and DIPs good ROM with no synovitis.    CDAI Exam: No CDAI exam completed.    Investigation: No additional findings.   Imaging: No results found.  Speciality Comments: No specialty comments available.    Procedures:  Large Joint Inj: R knee on  03/20/2017 2:31 PM Indications: pain Details: 27 G 1.5 in needle, medial approach  Arthrogram: No  Medications: 1.5 mL lidocaine 1 %; 40 mg triamcinolone acetonide 40 MG/ML Aspirate: 0 mL Outcome: tolerated well, no immediate complications Procedure, treatment alternatives, risks and benefits explained, specific risks discussed. Consent was given by the patient. Immediately prior to procedure a time out was called to verify the correct patient, procedure, equipment, support staff and site/side marked as required. Patient was prepped and draped in the usual sterile fashion.     Allergies: Codeine; Sulfonamide derivatives; and Penicillins   Assessment / Plan:     Visit Diagnoses: Primary osteoarthritis of both hands: No synovitis or tenderness on exam.  She has PIP and DIP synovial thickening.      Primary osteoarthritis of both knees - chondromalacia patella: She has moderate osteoarthritis of bilateral knees.  Most recent right knee x-ray on 11/21/16.  She is having increased right knee pain and intermittent swelling.  She had a cortisone injection performed  today.  She tolerated the procedure well.  She previously had hyaluronate injections bilaterally in August 2018.  Her left knee has been doing well since.  She does not want to re-apply for visco supplementation at this time.   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  Her lower back is doing well at this time.    Rheumatoid factor positive  Other medical conditions are listed as follows:   History of gastroesophageal reflux (GERD)  History of hyperlipidemia  History of IBS  History of hypertension  History of colon polyps  History of vitamin D deficiency  History of obesity    Orders: Orders Placed This Encounter  Procedures  . Large Joint Inj: R knee   No orders of the defined types were placed in this  encounter.    Follow-Up Instructions: Return in about 6 months (around 09/17/2017) for Osteoarthritis.   Bo Merino, MD  Note - This record has been created using Editor, commissioning.  Chart creation errors have been sought, but may not always  have been located. Such creation errors do not reflect on  the standard of medical care.

## 2017-03-20 ENCOUNTER — Encounter (INDEPENDENT_AMBULATORY_CARE_PROVIDER_SITE_OTHER): Payer: Self-pay

## 2017-03-20 ENCOUNTER — Ambulatory Visit (INDEPENDENT_AMBULATORY_CARE_PROVIDER_SITE_OTHER): Payer: Medicare HMO | Admitting: Rheumatology

## 2017-03-20 ENCOUNTER — Encounter: Payer: Self-pay | Admitting: Rheumatology

## 2017-03-20 VITALS — BP 128/76 | HR 75 | Resp 15 | Ht 59.5 in | Wt 178.0 lb

## 2017-03-20 DIAGNOSIS — R768 Other specified abnormal immunological findings in serum: Secondary | ICD-10-CM

## 2017-03-20 DIAGNOSIS — Z8639 Personal history of other endocrine, nutritional and metabolic disease: Secondary | ICD-10-CM

## 2017-03-20 DIAGNOSIS — M19041 Primary osteoarthritis, right hand: Secondary | ICD-10-CM | POA: Diagnosis not present

## 2017-03-20 DIAGNOSIS — Z8679 Personal history of other diseases of the circulatory system: Secondary | ICD-10-CM

## 2017-03-20 DIAGNOSIS — G8929 Other chronic pain: Secondary | ICD-10-CM

## 2017-03-20 DIAGNOSIS — M19042 Primary osteoarthritis, left hand: Secondary | ICD-10-CM | POA: Diagnosis not present

## 2017-03-20 DIAGNOSIS — M47816 Spondylosis without myelopathy or radiculopathy, lumbar region: Secondary | ICD-10-CM

## 2017-03-20 DIAGNOSIS — M25561 Pain in right knee: Secondary | ICD-10-CM

## 2017-03-20 DIAGNOSIS — Z8719 Personal history of other diseases of the digestive system: Secondary | ICD-10-CM

## 2017-03-20 DIAGNOSIS — Z86010 Personal history of colonic polyps: Secondary | ICD-10-CM

## 2017-03-20 DIAGNOSIS — M170 Bilateral primary osteoarthritis of knee: Secondary | ICD-10-CM

## 2017-03-20 MED ORDER — TRIAMCINOLONE ACETONIDE 40 MG/ML IJ SUSP
40.0000 mg | INTRAMUSCULAR | Status: AC | PRN
  Administered 2017-03-20: 40 mg via INTRA_ARTICULAR

## 2017-03-20 MED ORDER — LIDOCAINE HCL 1 % IJ SOLN
1.5000 mL | INTRAMUSCULAR | Status: AC | PRN
  Administered 2017-03-20: 1.5 mL

## 2017-10-31 HISTORY — PX: KNEE SURGERY: SHX244

## 2017-11-13 NOTE — Progress Notes (Deleted)
Office Visit Note  Patient: Alison Garza             Date of Birth: 1945/06/18           MRN: [id number]             PCP: Colonel Bald, MD Referring: Colonel Bald, MD Visit Date: 11/25/2017 Occupation: @GUAROCC @  Subjective:  No chief complaint on file.   History of Present Illness: Alison Garza is a 72 y.o. female ***   Activities of Daily Living:  Patient reports morning stiffness for *** {minute/hour:19697}.   Patient {ACTIONS;DENIES/REPORTS:21021675::"Denies"} nocturnal pain.  Difficulty dressing/grooming: {ACTIONS;DENIES/REPORTS:21021675::"Denies"} Difficulty climbing stairs: {ACTIONS;DENIES/REPORTS:21021675::"Denies"} Difficulty getting out of chair: {ACTIONS;DENIES/REPORTS:21021675::"Denies"} Difficulty using hands for taps, buttons, cutlery, and/or writing: {ACTIONS;DENIES/REPORTS:21021675::"Denies"}  No Rheumatology ROS completed.   PMFS History:  Patient Active Problem List   Diagnosis Date Noted  . Spondylosis of lumbar region without myelopathy or radiculopathy 04/30/2016  . Primary osteoarthritis of both hands 04/30/2016  . Primary osteoarthritis of both knees 04/30/2016  . Chondromalacia patellae, left knee 04/30/2016  . Chondromalacia, patella, right 04/30/2016  . History of hypertension 04/30/2016  . History of colon polyps 04/30/2016  . History of hyperlipidemia 04/30/2016  . History of IBS 04/30/2016  . History of gastroesophageal reflux (GERD) 04/30/2016  . Vitamin D deficiency 04/30/2016  . Osteoarthritis of both knees 09/21/2013  . Sebaceous cyst of breast 07/20/2013  . Left leg pain 02/23/2013  . Obesity (BMI 30-39.9) 10/21/2012  . Hypertension 05/23/2011  . LEG PAIN 04/13/2010  . LUMBAGO 03/12/2010  . COLONIC POLYPS 05/29/2009  . GERD 05/29/2009  . Hyperlipidemia 02/29/2008  . OVERACTIVE BLADDER 02/29/2008  . MENOPAUSE, SURGICAL 02/29/2008  . WEIGHT GAIN 02/29/2008    Past Medical History:  Diagnosis Date  .  Arthritis   . COLONIC POLYPS 05/29/2009  . GERD 05/29/2009  . HYPERLIPIDEMIA 02/29/2008  . IBS (irritable bowel syndrome)   . Lumbago 03/12/2010  . MENOPAUSE, SURGICAL 02/29/2008  . OVERACTIVE BLADDER 02/29/2008  . Ruptured lumbar disc   . Sciatic nerve pain    right    Family History  Problem Relation Age of Onset  . Pancreatic cancer Mother   . Hyperlipidemia Sister   . Cerebral aneurysm Brother 48  . Deep vein thrombosis Daughter   . Stroke Unknown        maternal family  . Colon cancer Neg Hx   . Esophageal cancer Neg Hx    Past Surgical History:  Procedure Laterality Date  . ABDOMINAL HYSTERECTOMY  1976   fiboids  . APPENDECTOMY  1973  . CESAREAN SECTION     x 2  . CHOLECYSTECTOMY  1984  . COLON SURGERY  2006   polyp removed  . [place]   to rotate kidney  . OVARIAN CYST REMOVAL  2009   Social History   Social History Narrative  . Not on file    Objective: Vital Signs: There were no vitals taken for this visit.   Physical Exam   Musculoskeletal Exam: ***  CDAI Exam: CDAI Score: Not documented Patient Global Assessment: Not documented; Provider Global Assessment: Not documented Swollen: Not documented; Tender: Not documented Joint Exam   Not documented   There is currently no information documented on the homunculus. Go to the Rheumatology activity and complete the homunculus joint exam.  Investigation: No additional findings.  Imaging: No results found.  Recent Labs: Lab Results  Component Value Date   WBC 9.9 10/13/2014  HGB 14.3 10/13/2014   PLT 204.0 10/13/2014   NA 138 01/03/2014   K 3.4 (L) 01/03/2014   CL 106 01/03/2014   CO2 28 01/03/2014   GLUCOSE 77 01/03/2014   BUN 15 01/03/2014   CREATININE 0.7 01/03/2014   BILITOT 0.7 01/03/2014   ALKPHOS 42 01/03/2014   AST 20 01/03/2014   ALT 24 01/03/2014   PROT 6.5 01/03/2014   ALBUMIN 2.9 (L) 01/03/2014   CALCIUM 8.5 01/03/2014   GFRAA >90 10/18/2012    Speciality  Comments: No specialty comments available.  Procedures:  No procedures performed Allergies: Codeine; Sulfonamide derivatives; and Penicillins   Assessment / Plan:     Visit Diagnoses: No diagnosis found.   Orders: No orders of the defined types were placed in this encounter.  No orders of the defined types were placed in this encounter.   Face-to-face time spent with patient was *** minutes. Greater than 50% of time was spent in counseling and coordination of care.  Follow-Up Instructions: No follow-ups on file.   Earnestine Mealing, CMA  Note - This record has been created using Editor, commissioning.  Chart creation errors have been sought, but may not always  have been located. Such creation errors do not reflect on  the standard of medical care.

## 2017-11-25 ENCOUNTER — Ambulatory Visit: Payer: Medicare HMO | Admitting: Rheumatology

## 2018-02-27 NOTE — Progress Notes (Signed)
Office Visit Note  Patient: Alison Garza             Date of Birth: 23-Jan-1946           MRN: [id number]             PCP: Colonel Bald, MD Referring: Colonel Bald, MD Visit Date: 03/11/2018 Occupation: @GUAROCC @  Subjective:  Pain in both knee joints   History of Present Illness: Alison Garza is a 72 y.o. female with history of osteoarthritis.  She presents today with pain in bilateral knee joints.  She states on 10/31/17 she had a right knee meniscal repair by Dr. Len Childs.  She states she went to 1 session of PT, but the exercises exacerbated her lower back pain so she stopped going.  She does not want to proceed with knee replacements.  She would like bilateral knee joint cortisone injections today. She has been alternating heat and ice and taking tylenol arthritis for pain relief.  She is planning on going to water aerobics and is going to continue to try to lose weight.  She has started to eliminate sugar from her diet.  She denies any other joint pain or joint swelling.   Activities of Daily Living:  Patient reports morning stiffness for 2 minutes.   Patient Reports nocturnal pain.  Difficulty dressing/grooming: Denies Difficulty climbing stairs: Reports Difficulty getting out of chair: Denies Difficulty using hands for taps, buttons, cutlery, and/or writing: Denies  Review of Systems  Constitutional: Negative for fatigue.  HENT: Negative for mouth sores, mouth dryness and nose dryness.   Eyes: Positive for dryness (Systane drops). Negative for pain and visual disturbance.  Respiratory: Negative for cough, hemoptysis, shortness of breath and difficulty breathing.   Cardiovascular: Negative for chest pain, palpitations, hypertension and swelling in legs/feet.  Gastrointestinal: Positive for constipation. Negative for blood in stool and diarrhea.  Endocrine: Negative for increased urination.  Genitourinary: Negative for painful urination.  Musculoskeletal:  Positive for arthralgias, joint pain and morning stiffness. Negative for joint swelling, myalgias, muscle weakness, muscle tenderness and myalgias.  Skin: Negative for color change, pallor, rash, hair loss, nodules/bumps, skin tightness, ulcers and sensitivity to sunlight.  Allergic/Immunologic: Negative for susceptible to infections.  Neurological: Negative for dizziness, numbness, headaches and weakness.  Hematological: Negative for swollen glands.  Psychiatric/Behavioral: Negative for depressed mood and sleep disturbance. The patient is not nervous/anxious.     PMFS History:  Patient Active Problem List   Diagnosis Date Noted  . Spondylosis of lumbar region without myelopathy or radiculopathy 04/30/2016  . Primary osteoarthritis of both hands 04/30/2016  . Primary osteoarthritis of both knees 04/30/2016  . Chondromalacia patellae, left knee 04/30/2016  . Chondromalacia, patella, right 04/30/2016  . History of hypertension 04/30/2016  . History of colon polyps 04/30/2016  . History of hyperlipidemia 04/30/2016  . History of IBS 04/30/2016  . History of gastroesophageal reflux (GERD) 04/30/2016  . Vitamin D deficiency 04/30/2016  . Osteoarthritis of both knees 09/21/2013  . Sebaceous cyst of breast 07/20/2013  . Left leg pain 02/23/2013  . Obesity (BMI 30-39.9) 10/21/2012  . Hypertension 05/23/2011  . LEG PAIN 04/13/2010  . LUMBAGO 03/12/2010  . COLONIC POLYPS 05/29/2009  . GERD 05/29/2009  . Hyperlipidemia 02/29/2008  . OVERACTIVE BLADDER 02/29/2008  . MENOPAUSE, SURGICAL 02/29/2008  . WEIGHT GAIN 02/29/2008    Past Medical History:  Diagnosis Date  . Arthritis   . COLONIC POLYPS 05/29/2009  . GERD 05/29/2009  . HYPERLIPIDEMIA  02/29/2008  . IBS (irritable bowel syndrome)   . Lumbago 03/12/2010  . MENOPAUSE, SURGICAL 02/29/2008  . OVERACTIVE BLADDER 02/29/2008  . Ruptured lumbar disc   . Sciatic nerve pain    right    Family History  Problem Relation Age of Onset  .  Pancreatic cancer Mother   . Hyperlipidemia Sister   . Cerebral aneurysm Brother 38  . Deep vein thrombosis Daughter   . Stroke Other        maternal family  . Colon cancer Neg Hx   . Esophageal cancer Neg Hx    Past Surgical History:  Procedure Laterality Date  . ABDOMINAL HYSTERECTOMY  1976   fiboids  . APPENDECTOMY  1973  . CESAREAN SECTION     x 2  . CHOLECYSTECTOMY  1984  . COLON SURGERY  2006   polyp removed  . [place]   to rotate kidney  . KNEE SURGERY Right 10/31/2017   meniscal repair  . OVARIAN CYST REMOVAL  2009   Social History   Social History Narrative  . Not on file    Objective: Vital Signs: BP 133/85 (BP Location: Left Arm, Patient Position: Sitting, Cuff Size: Normal)   Pulse 91   Resp 15   Ht 5' (1.524 m)   Wt 174 lb 6.4 oz (79.1 kg)   BMI 34.06 kg/m    Physical Exam Vitals signs and nursing note reviewed.  Constitutional:      Appearance: She is well-developed.  HENT:     Head: Normocephalic and atraumatic.  Eyes:     Conjunctiva/sclera: Conjunctivae normal.  Neck:     Musculoskeletal: Normal range of motion.  Cardiovascular:     Rate and Rhythm: Normal rate and regular rhythm.     Heart sounds: Normal heart sounds.  Pulmonary:     Effort: Pulmonary effort is normal.     Breath sounds: Normal breath sounds.  Abdominal:     General: Bowel sounds are normal.     Palpations: Abdomen is soft.  Lymphadenopathy:     Cervical: No cervical adenopathy.  Skin:    General: Skin is warm and dry.     Capillary Refill: Capillary refill takes less than 2 seconds.  Neurological:     Mental Status: She is alert and oriented to person, place, and time.  Psychiatric:        Behavior: Behavior normal.      Musculoskeletal Exam: C-spine, thoracic spine, and lumbar spine good ROM.  No midline spinal tenderness.  No SI joint tenderness.  Shoulder joints, elbow joints, wrist joints, MCPs, PIPs, and DIPs good ROM with no synovitis.  She  has complete fist formation bilaterally.  Hip joints, knee joints, ankle joints, MTPs, PIPs, and DIPs good ROM with no synovitis.  Right knee warmth on exam.  No warmth or effusion of the left knee joint on exam.  No tenderness or swelling of ankle joints.  No tenderness over trochanteric bursa bilaterally.   CDAI Exam: CDAI Score: Not documented Patient Global Assessment: Not documented; Provider Global Assessment: Not documented Swollen: Not documented; Tender: Not documented Joint Exam   Not documented   There is currently no information documented on the homunculus. Go to the Rheumatology activity and complete the homunculus joint exam.  Investigation: No additional findings.  Imaging: No results found.  Recent Labs: Lab Results  Component Value Date   WBC 9.9 10/13/2014   HGB 14.3 10/13/2014   PLT 204.0 10/13/2014  NA 138 01/03/2014   K 3.4 (L) 01/03/2014   CL 106 01/03/2014   CO2 28 01/03/2014   GLUCOSE 77 01/03/2014   BUN 15 01/03/2014   CREATININE 0.7 01/03/2014   BILITOT 0.7 01/03/2014   ALKPHOS 42 01/03/2014   AST 20 01/03/2014   ALT 24 01/03/2014   PROT 6.5 01/03/2014   ALBUMIN 2.9 (L) 01/03/2014   CALCIUM 8.5 01/03/2014   GFRAA >90 10/18/2012    Speciality Comments: No specialty comments available.  Procedures:  Large Joint Inj: bilateral knee on 03/11/2018 2:23 PM Indications: pain Details: 27 G 1.5 in needle, medial approach  Arthrogram: No  Medications (Right): 1.5 mL lidocaine 1 %; 40 mg triamcinolone acetonide 40 MG/ML Aspirate (Right): 0 mL Medications (Left): 1.5 mL lidocaine 1 %; 40 mg triamcinolone acetonide 40 MG/ML Aspirate (Left): 0 mL Outcome: tolerated well, no immediate complications Procedure, treatment alternatives, risks and benefits explained, specific risks discussed. Consent was given by the patient. Immediately prior to procedure a time out was called to verify the correct patient, procedure, equipment, support staff and  site/side marked as required. Patient was prepped and draped in the usual sterile fashion.     Allergies: Codeine; Sulfonamide derivatives; and Penicillins   Assessment / Plan:     Visit Diagnoses: Primary osteoarthritis of both hands: She has PIP and DIP synovial thickening consistent with osteoarthritis of both hands.  She has no synovitis.  Joint protection and muscle strengthening consistent with osteoarthritis.    Primary osteoarthritis of both knees: She has chronic pain in both knee joints.  She has good ROM on exam. She has warmth of the right knee joint.  No effusion noted.  She had a right knee meniscal repair by Dr. Len Childs on 10/31/17.  She attended 1 session of PT and discontinued due to excerbating her lower back pain.  She requested bilateral knee joint cortisone injections today.  She tolerated the procedure well.  Procedure note is completed above.  She will notify us when she would [place] to apply for visco gel injections.  She is going to continue taking tylenol arthritis PRN for pain relief.  She was given a handout of knee exercises to perform at home.   Rheumatoid factor positive:She has no synovitis on exam.   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  She has no midline spinal tenderness at this time.  Her lower back pain was exacerbated after attending PT for her right knee joint, but her lower back pain has improved.   Other medical conditions are listed as follows:  History of gastroesophageal reflux (GERD)  History of hyperlipidemia  History of IBS  History of colon polyps  History of hypertension  History of vitamin D deficiency   Orders: Orders Placed This Encounter  Procedures  . Large Joint Inj   No orders of the defined types were placed in this encounter.    Follow-Up Instructions: Return in about 6 months (around 09/09/2018) for Osteoarthritis.   Ofilia Neas, PA-C  Note - This record has  been created using Dragon software.  Chart creation errors have been sought, but may not always  have been located. Such creation errors do not reflect on  the standard of medical care.

## 2018-03-11 ENCOUNTER — Ambulatory Visit (INDEPENDENT_AMBULATORY_CARE_PROVIDER_SITE_OTHER): Payer: Medicare HMO | Admitting: Physician Assistant

## 2018-03-11 ENCOUNTER — Encounter: Payer: Self-pay | Admitting: Physician Assistant

## 2018-03-11 VITALS — BP 133/85 | HR 91 | Resp 15 | Ht 60.0 in | Wt 174.4 lb

## 2018-03-11 DIAGNOSIS — M47816 Spondylosis without myelopathy or radiculopathy, lumbar region: Secondary | ICD-10-CM | POA: Diagnosis not present

## 2018-03-11 DIAGNOSIS — G8929 Other chronic pain: Secondary | ICD-10-CM

## 2018-03-11 DIAGNOSIS — M19042 Primary osteoarthritis, left hand: Secondary | ICD-10-CM

## 2018-03-11 DIAGNOSIS — Z8719 Personal history of other diseases of the digestive system: Secondary | ICD-10-CM

## 2018-03-11 DIAGNOSIS — Z8639 Personal history of other endocrine, nutritional and metabolic disease: Secondary | ICD-10-CM

## 2018-03-11 DIAGNOSIS — M25561 Pain in right knee: Secondary | ICD-10-CM

## 2018-03-11 DIAGNOSIS — M19041 Primary osteoarthritis, right hand: Secondary | ICD-10-CM | POA: Diagnosis not present

## 2018-03-11 DIAGNOSIS — Z8679 Personal history of other diseases of the circulatory system: Secondary | ICD-10-CM

## 2018-03-11 DIAGNOSIS — Z860100 Personal history of colon polyps, unspecified: Secondary | ICD-10-CM

## 2018-03-11 DIAGNOSIS — R768 Other specified abnormal immunological findings in serum: Secondary | ICD-10-CM | POA: Diagnosis not present

## 2018-03-11 DIAGNOSIS — M25562 Pain in left knee: Secondary | ICD-10-CM

## 2018-03-11 DIAGNOSIS — M170 Bilateral primary osteoarthritis of knee: Secondary | ICD-10-CM | POA: Diagnosis not present

## 2018-03-11 MED ORDER — TRIAMCINOLONE ACETONIDE 40 MG/ML IJ SUSP
40.0000 mg | INTRAMUSCULAR | Status: AC | PRN
  Administered 2018-03-11: 40 mg via INTRA_ARTICULAR

## 2018-03-11 MED ORDER — LIDOCAINE HCL 1 % IJ SOLN
1.5000 mL | INTRAMUSCULAR | Status: AC | PRN
  Administered 2018-03-11: 1.5 mL

## 2018-03-11 NOTE — Patient Instructions (Signed)

## 2018-08-28 NOTE — Progress Notes (Deleted)
Office Visit Note  Patient: Alison Garza             Date of Birth: [birth year]           MRN: [id number]             PCP: Colonel Bald, MD Referring: Colonel Bald, MD Visit Date: 09/10/2018 Occupation: @GUAROCC @  Subjective:  No chief complaint on file.   History of Present Illness: Alison Garza is a 73 y.o. female ***   Activities of Daily Living:  Patient reports morning stiffness for *** {minute/hour:19697}.   Patient {ACTIONS;DENIES/REPORTS:21021675::"Denies"} nocturnal pain.  Difficulty dressing/grooming: {ACTIONS;DENIES/REPORTS:21021675::"Denies"} Difficulty climbing stairs: {ACTIONS;DENIES/REPORTS:21021675::"Denies"} Difficulty getting out of chair: {ACTIONS;DENIES/REPORTS:21021675::"Denies"} Difficulty using hands for taps, buttons, cutlery, and/or writing: {ACTIONS;DENIES/REPORTS:21021675::"Denies"}  No Rheumatology ROS completed.   PMFS History:  Patient Active Problem List   Diagnosis Date Noted  . Spondylosis of lumbar region without myelopathy or radiculopathy 04/30/2016  . Primary osteoarthritis of both hands 04/30/2016  . Primary osteoarthritis of both knees 04/30/2016  . Chondromalacia patellae, left knee 04/30/2016  . Chondromalacia, patella, right 04/30/2016  . History of hypertension 04/30/2016  . History of colon polyps 04/30/2016  . History of hyperlipidemia 04/30/2016  . History of IBS 04/30/2016  . History of gastroesophageal reflux (GERD) 04/30/2016  . Vitamin D deficiency 04/30/2016  . Osteoarthritis of both knees 09/21/2013  . Sebaceous cyst of breast 07/20/2013  . Left leg pain 02/23/2013  . Obesity (BMI 30-39.9) 10/21/2012  . Hypertension 05/23/2011  . LEG PAIN 04/13/2010  . LUMBAGO 03/12/2010  . COLONIC POLYPS 05/29/2009  . GERD 05/29/2009  . Hyperlipidemia 02/29/2008  . OVERACTIVE BLADDER 02/29/2008  . MENOPAUSE, SURGICAL 02/29/2008  . WEIGHT GAIN 02/29/2008    Past Medical History:  Diagnosis Date  .  Arthritis   . COLONIC POLYPS 05/29/2009  . GERD 05/29/2009  . HYPERLIPIDEMIA 02/29/2008  . IBS (irritable bowel syndrome)   . Lumbago 03/12/2010  . MENOPAUSE, SURGICAL 02/29/2008  . OVERACTIVE BLADDER 02/29/2008  . Ruptured lumbar disc   . Sciatic nerve pain    right    Family History  Problem Relation Age of Onset  . Pancreatic cancer Mother   . Hyperlipidemia Sister   . Cerebral aneurysm Brother 46  . Deep vein thrombosis Daughter   . Stroke Other        maternal family  . Colon cancer Neg Hx   . Esophageal cancer Neg Hx    Past Surgical History:  Procedure Laterality Date  . ABDOMINAL HYSTERECTOMY  1976   fiboids  . APPENDECTOMY  1973  . CESAREAN SECTION     x 2  . CHOLECYSTECTOMY  1984  . COLON SURGERY  2006   polyp removed  . [place]   to rotate kidney  . KNEE SURGERY Right 10/31/2017   meniscal repair  . OVARIAN CYST REMOVAL  2009   Social History   Social History Narrative  . Not on file   Immunization History  Administered Date(s) Administered  . Influenza Split 11/28/2010, 12/25/2011  . Influenza Whole 01/03/2008, 11/16/2009  . Influenza,inj,Quad PF,6+ Mos 01/04/2013, 11/18/2013  . Pneumococcal Conjugate-13 01/10/2014  . Pneumococcal Polysaccharide-23 11/28/2010  . Td 07/02/2005  . Zoster 02/01/2014     Objective: Vital Signs: There were no vitals taken for this visit.   Physical Exam   Musculoskeletal Exam: ***  CDAI Exam: CDAI Score: - Patient Global: -; Provider Global: - Swollen: -; Tender: - Joint Exam  No joint exam has been documented for this visit   There is currently no information documented on the homunculus. Go to the Rheumatology activity and complete the homunculus joint exam.  Investigation: No additional findings.  Imaging: No results found.  Recent Labs: Lab Results  Component Value Date   WBC 9.9 10/13/2014   HGB 14.3 10/13/2014   PLT 204.0 10/13/2014   NA 138 01/03/2014   K 3.4 (L) 01/03/2014    CL 106 01/03/2014   CO2 28 01/03/2014   GLUCOSE 77 01/03/2014   BUN 15 01/03/2014   CREATININE 0.7 01/03/2014   BILITOT 0.7 01/03/2014   ALKPHOS 42 01/03/2014   AST 20 01/03/2014   ALT 24 01/03/2014   PROT 6.5 01/03/2014   ALBUMIN 2.9 (L) 01/03/2014   CALCIUM 8.5 01/03/2014   GFRAA >90 10/18/2012    Speciality Comments: No specialty comments available.  Procedures:  No procedures performed Allergies: Codeine, Sulfonamide derivatives, and Penicillins   Assessment / Plan:     Visit Diagnoses: No diagnosis found.   Orders: No orders of the defined types were placed in this encounter.  No orders of the defined types were placed in this encounter.   Face-to-face time spent with patient was *** minutes. Greater than 50% of time was spent in counseling and coordination of care.  Follow-Up Instructions: No follow-ups on file.   Earnestine Mealing, CMA  Note - This record has been created using Editor, commissioning.  Chart creation errors have been sought, but may not always  have been located. Such creation errors do not reflect on  the standard of medical care.

## 2018-09-10 ENCOUNTER — Ambulatory Visit: Payer: Self-pay | Admitting: Physician Assistant

## 2019-10-22 ENCOUNTER — Other Ambulatory Visit: Payer: Self-pay | Admitting: Student

## 2019-10-22 DIAGNOSIS — R600 Localized edema: Secondary | ICD-10-CM

## 2019-10-26 ENCOUNTER — Ambulatory Visit
Admission: RE | Admit: 2019-10-26 | Discharge: 2019-10-26 | Disposition: A | Discharge status: Home / Self Care | Payer: Medicare HMO | Source: Ambulatory Visit | Attending: Student | Admitting: Student

## 2019-10-26 DIAGNOSIS — R600 Localized edema: Secondary | ICD-10-CM

## 2021-03-05 ENCOUNTER — Other Ambulatory Visit: Payer: Self-pay

## 2021-03-05 ENCOUNTER — Encounter (HOSPITAL_COMMUNITY): Payer: Self-pay

## 2021-03-05 ENCOUNTER — Emergency Department (HOSPITAL_COMMUNITY): Payer: Medicare HMO

## 2021-03-05 ENCOUNTER — Emergency Department (HOSPITAL_COMMUNITY)
Admission: EM | Admit: 2021-03-05 | Discharge: 2021-03-05 | Disposition: A | Discharge status: Home / Self Care | Payer: Medicare HMO | Attending: Emergency Medicine | Admitting: Emergency Medicine

## 2021-03-05 DIAGNOSIS — M79669 Pain in unspecified lower leg: Secondary | ICD-10-CM | POA: Diagnosis not present

## 2021-03-05 DIAGNOSIS — M25512 Pain in left shoulder: Secondary | ICD-10-CM | POA: Insufficient documentation

## 2021-03-05 DIAGNOSIS — M545 Low back pain, unspecified: Secondary | ICD-10-CM | POA: Insufficient documentation

## 2021-03-05 DIAGNOSIS — W19XXXA Unspecified fall, initial encounter: Secondary | ICD-10-CM | POA: Insufficient documentation

## 2021-03-05 MED ORDER — TRAMADOL HCL 50 MG PO TABS: 50.0000 mg | ORAL_TABLET | Freq: Four times a day (QID) | ORAL | 0 refills | Status: AC | PRN

## 2021-03-05 MED ORDER — ACETAMINOPHEN 325 MG PO TABS
650.0000 mg | ORAL_TABLET | Freq: Once | ORAL | Status: DC
  Filled 2021-03-05: qty 2

## 2021-03-05 MED ORDER — TRAMADOL HCL 50 MG PO TABS
50.0000 mg | ORAL_TABLET | Freq: Once | ORAL | Status: AC
  Administered 2021-03-05: 50 mg via ORAL
  Filled 2021-03-05: qty 1

## 2021-03-05 MED ORDER — TRAMADOL HCL 50 MG PO TABS: 50.0000 mg | ORAL_TABLET | Freq: Four times a day (QID) | ORAL | 0 refills | Status: DC | PRN

## 2021-03-05 NOTE — ED Triage Notes (Signed)
Pt reports falling between her bed and nightstand yesterday. She reports falling on her left side. Pt now endorses left shoulder and leg pain. Pt denies hitting her head. Pt does take Eliquis. Pt reports she was on the ground for about 30 minutes.

## 2021-03-05 NOTE — ED Provider Notes (Signed)
Emergency Medicine Provider Triage Evaluation Note  TONJUA ROSSETTI , a 76 y.o. female  was evaluated in triage.  Pt complains of mechanical fall with pain in low back and tailbone.  Review of Systems  Positive: Fall, felt to be mechanical, Negative: No blood thinners, did not strike head, no lateralized weakness  Physical Exam  BP 140/84 (BP Location: Right Arm)    Pulse 86    Temp 98.1 F (36.7 C) (Oral)    Resp 16    SpO2 98%  Gen:   Awake, no distress   Resp:  Normal effort  MSK:   Moves extremities without difficulty tenderness over left shoulder, tenderness over low back and pelvis area Other:  Awake alert no lateralized weakness  Medical Decision Making  Medically screening exam initiated at 10:49 AM.  Appropriate orders placed.  Tylee K Sookdeo was informed that the remainder of the evaluation will be completed by another provider, this initial triage assessment does not replace that evaluation, and the importance of remaining in the ED until their evaluation is complete.  Imaging orders placed   Pattricia Boss, MD 03/05/21 1051

## 2021-03-05 NOTE — ED Provider Notes (Signed)
[facility] DEPT Provider Note   CSN: [id number] Arrival date & time: 03/05/21  1009     History Chief Complaint  Patient presents with   Fall    Alison Garza is a 76 y.o. female.  HPI    76 year old female presents today complaining of pain after fall.  States that she landed on her left side.  She is complaining of pain in her low back.  He is on Eliquis.  She denies striking her head and does not report any neck pain.  Home Medications Prior to Admission medications   Medication Sig Start Date End Date Taking? Authorizing Provider  atorvastatin (LIPITOR) 10 MG tablet Take 1 tablet (10 mg total) by mouth daily. 07/07/14   Burchette, Alinda Sierras, MD  candesartan (ATACAND) 16 MG tablet daily.    [provider]  estradiol (ESTRACE) 1 MG tablet Take 1 tablet (1 mg total) by mouth daily. 11/18/13   Burchette, Alinda Sierras, MD  furosemide (LASIX) 40 MG tablet Take 1 tablet (40 mg total) by mouth daily. Patient taking differently: Take 20 mg by mouth as needed.  05/26/14   Burchette, Alinda Sierras, MD  Multiple Vitamin (MULITIVITAMIN WITH MINERALS) TABS Take 1 tablet by mouth daily.      [provider]  Omega-3 Fatty Acids (FISH OIL PO) Take by mouth daily.    [provider]  pantoprazole (PROTONIX) 40 MG tablet daily.    [provider]  Polyethyl Glycol-Propyl Glycol (SYSTANE OP) Apply to eye 3 (three) times daily.    [provider]  Vitamin D, Cholecalciferol, 1000 units TABS Take by mouth daily.    [provider]  omeprazole (PRILOSEC OTC) 20 MG tablet Take 1 tablet (20 mg total) by mouth 2 (two) times daily. 04/24/11 10/18/12  Eulas Post, MD      Allergies    Codeine, Sulfonamide derivatives, and Penicillins    Review of Systems   Review of Systems  All other systems reviewed and are negative.  Physical Exam Updated Vital Signs BP (!) 151/96 (BP Location: Right Arm)    Pulse 100    Temp 98.1 F  (36.7 C) (Oral)    Resp 18    SpO2 100%  Physical Exam Vitals and nursing note reviewed.  Constitutional:      Appearance: Normal appearance.  HENT:     Head: Normocephalic and atraumatic.     Right Ear: External ear normal.     Left Ear: External ear normal.     Nose: Nose normal.     Mouth/Throat:     Mouth: Mucous membranes are moist.  Eyes:     Extraocular Movements: Extraocular movements intact.     Pupils: Pupils are equal, round, and reactive to light.  Cardiovascular:     Rate and Rhythm: Normal rate and regular rhythm.  Pulmonary:     Effort: Pulmonary effort is normal.     Breath sounds: Normal breath sounds.  Abdominal:     General: Abdomen is flat. Bowel sounds are normal.     Palpations: Abdomen is soft.     Tenderness: There is no abdominal tenderness.  Musculoskeletal:        General: Normal range of motion.     Cervical back: Normal range of motion and neck supple.     Comments: Some tenderness palpation over left shoulder Some tenderness palpation in low back and sacral area  Skin:    General: Skin is warm.  Capillary Refill: Capillary refill takes less than 2 seconds.  Neurological:     General: No focal deficit present.     Mental Status: She is alert.  Psychiatric:        Mood and Affect: Mood normal.    ED Results / Procedures / Treatments   Labs (all labs ordered are listed, but only abnormal results are displayed) Labs Reviewed - No data to display  EKG None  Radiology DG Lumbar Spine 2-3 Views  Result Date: 03/05/2021 CLINICAL DATA:  Fall.  Back pain. EXAM: LUMBAR SPINE - 2-3 VIEW COMPARISON:  None. FINDINGS: Transitional lumbosacral vertebra, designated S1. No fracture or bone lesion. Grade 1 anterolisthesis of L4 on L5 and L5 on S1. No other spondylolisthesis. Mild loss of disc height at L4-L5 and L5-S1. Skeletal structures are demineralized. Soft tissues are unremarkable. IMPRESSION: 1. No fracture or acute finding. 2. Degenerative  changes and transitional lumbosacral vertebra as described. Electronically Signed   By: Lajean Manes M.D.   On: 03/05/2021 12:01   DG Pelvis 1-2 Views  Result Date: 03/05/2021 CLINICAL DATA:  Fall.  Pain. EXAM: PELVIS - 1-2 VIEW COMPARISON:  None. FINDINGS: No fracture or bone lesion. Hip joints, SI joints and symphysis pubis normally spaced and aligned. Soft tissues are unremarkable. IMPRESSION: No fracture or dislocation. Electronically Signed   By: Lajean Manes M.D.   On: 03/05/2021 12:00   DG Shoulder Left  Result Date: 03/05/2021 CLINICAL DATA:  Fall.  Left shoulder pain. EXAM: LEFT SHOULDER - 2+ VIEW COMPARISON:  06/23/2019 FINDINGS: No fracture or bone lesion. Glenohumeral and AC joints are normally spaced and aligned. Skeletal structures are diffusely demineralized. IMPRESSION: No fracture or dislocation Electronically Signed   By: Lajean Manes M.D.   On: 03/05/2021 12:02    Procedures Procedures    Medications Ordered in ED Medications  acetaminophen (TYLENOL) tablet 650 mg (650 mg Oral Patient Refused/Not Given 03/05/21 1317)  traMADol (ULTRAM) tablet 50 mg (has no administration in time range)    ED Course/ Medical Decision Making/ A&P                           Medical Decision Making 75 year old female presents today complaining of fall.  She has pain in her shoulder, low back to sacral area X-rays obtained show no evidence of acute fracture.  Patient is in between doctors and is awaiting establishing with new primary care physician  She requests pain medicine.  She cannot take acetaminophen or ibuprofen.  She is given dose of tramadol here and will be given a short prescription for tramadol. Final Clinical Impression(s) / ED Diagnoses Final diagnoses:  None    Rx / DC Orders ED Discharge Orders     None         Pattricia Boss, MD 03/05/21 1452

## 2021-03-05 NOTE — Discharge Instructions (Signed)
No evidence of broken bones was noted on your x-rays. Please follow-up with your primary care doctor or your orthopedist as soon as possible Return if you are having worsening symptoms anytime

## 2021-03-26 ENCOUNTER — Other Ambulatory Visit: Payer: Self-pay | Admitting: Family Medicine

## 2021-03-26 DIAGNOSIS — R29898 Other symptoms and signs involving the musculoskeletal system: Secondary | ICD-10-CM

## 2021-03-26 DIAGNOSIS — M25512 Pain in left shoulder: Secondary | ICD-10-CM

## 2021-04-13 ENCOUNTER — Ambulatory Visit
Admission: RE | Admit: 2021-04-13 | Discharge: 2021-04-13 | Disposition: A | Discharge status: Home / Self Care | Payer: Medicare HMO | Source: Ambulatory Visit | Attending: Family Medicine | Admitting: Family Medicine

## 2021-04-13 DIAGNOSIS — M25512 Pain in left shoulder: Secondary | ICD-10-CM

## 2021-04-13 DIAGNOSIS — R29898 Other symptoms and signs involving the musculoskeletal system: Secondary | ICD-10-CM

## 2021-07-20 ENCOUNTER — Other Ambulatory Visit (HOSPITAL_COMMUNITY): Payer: Self-pay | Admitting: *Deleted

## 2021-07-20 DIAGNOSIS — R1310 Dysphagia, unspecified: Secondary | ICD-10-CM

## 2021-07-25 ENCOUNTER — Ambulatory Visit (HOSPITAL_COMMUNITY)
Admission: RE | Admit: 2021-07-25 | Discharge: 2021-07-25 | Disposition: A | Discharge status: Home / Self Care | Payer: Medicare HMO | Source: Ambulatory Visit | Attending: Internal Medicine | Admitting: Internal Medicine

## 2021-07-25 DIAGNOSIS — R1310 Dysphagia, unspecified: Secondary | ICD-10-CM

## 2021-07-25 NOTE — Progress Notes (Signed)
Modified Barium Swallow Progress Note  Patient Details  Name: Alison Garza MRN: [id number] Date of Birth: January 16, 1946  Today's Date: 07/25/2021  Modified Barium Swallow completed.  Full report located under Chart Review in the Imaging Section.  Brief recommendations include the following:  Clinical Impression  Pt demonstrates mild oropharyngeal dysphagia with slight hesitation in initiation of swallow. Pharyngeal strength appears WNL, no aspiration or residue occured. However, there was also decreased opening of the cricopharyngeus (possibly due to changes in the cervical spine or pts report of a hx of GERD). This led to mild residue in the CP segment with backflow to the pyriform sinus. Pt appears to sense this, swallow again or even cough at times, though there is no aspiration or penetration. Esophageal sweep shows puree residue throughout the esophagus. Reported result to pt and family, recommend continuing regular diet and thin liquids, but with f/u with ENT and neurologist given changes.   Swallow Evaluation Recommendations       SLP Diet Recommendations: Regular solids;Thin liquid   Liquid Administration via: Cup;Straw   Medication Administration: Whole meds with liquid   Supervision: Patient able to self feed   Compensations: Slow rate;Small sips/bites;Follow solids with liquid   Postural Changes: Remain semi-upright after after feeds/meals (Comment);Seated upright at 90 degrees   Oral Care Recommendations: Patient independent with oral care        Jun Osment, Katherene Ponto 07/25/2021,1:20 PM

## 2022-03-04 DEATH — deceased
